# Patient Record
Sex: Male | Born: 1940 | Race: White | Hispanic: No | Marital: Married | Smoking: Current every day smoker
Health system: Southern US, Community
[De-identification: ages and names within clinical notes are randomized; demographics above are authoritative.]

## PROBLEM LIST (undated history)

## (undated) DIAGNOSIS — Z8551 Personal history of malignant neoplasm of bladder: Secondary | ICD-10-CM

## (undated) DIAGNOSIS — I6930 Unspecified sequelae of cerebral infarction: Secondary | ICD-10-CM

## (undated) DIAGNOSIS — M48061 Spinal stenosis, lumbar region without neurogenic claudication: Secondary | ICD-10-CM

## (undated) DIAGNOSIS — I491 Atrial premature depolarization: Secondary | ICD-10-CM

## (undated) DIAGNOSIS — I639 Cerebral infarction, unspecified: Secondary | ICD-10-CM

## (undated) DIAGNOSIS — I6529 Occlusion and stenosis of unspecified carotid artery: Secondary | ICD-10-CM

## (undated) DIAGNOSIS — Z860100 Personal history of colon polyps, unspecified: Secondary | ICD-10-CM

## (undated) DIAGNOSIS — Z87898 Personal history of other specified conditions: Secondary | ICD-10-CM

## (undated) DIAGNOSIS — Z8679 Personal history of other diseases of the circulatory system: Secondary | ICD-10-CM

## (undated) DIAGNOSIS — I700 Atherosclerosis of aorta: Secondary | ICD-10-CM

## (undated) DIAGNOSIS — R001 Bradycardia, unspecified: Secondary | ICD-10-CM

## (undated) DIAGNOSIS — M1990 Unspecified osteoarthritis, unspecified site: Secondary | ICD-10-CM

## (undated) DIAGNOSIS — I10 Essential (primary) hypertension: Secondary | ICD-10-CM

## (undated) HISTORY — DX: Cerebral infarction, unspecified: I63.9

## (undated) HISTORY — PX: OTHER SURGICAL HISTORY: SHX169

---
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
## 1999-10-26 ENCOUNTER — Encounter: Payer: Self-pay | Admitting: Specialist

## 1999-10-26 ENCOUNTER — Ambulatory Visit (HOSPITAL_COMMUNITY): Admission: RE | Admit: 1999-10-26 | Discharge: 1999-10-26 | Payer: Self-pay | Admitting: Specialist

## 2001-08-17 ENCOUNTER — Ambulatory Visit (HOSPITAL_COMMUNITY): Admission: RE | Admit: 2001-08-17 | Discharge: 2001-08-17 | Payer: Self-pay | Admitting: Gastroenterology

## 2001-08-17 ENCOUNTER — Encounter (INDEPENDENT_AMBULATORY_CARE_PROVIDER_SITE_OTHER): Payer: Self-pay

## 2010-03-24 ENCOUNTER — Ambulatory Visit
Admission: RE | Admit: 2010-03-24 | Discharge: 2010-03-25 | Payer: Self-pay | Source: Home / Self Care | Attending: Urology | Admitting: Urology

## 2010-03-24 HISTORY — PX: TRANSURETHRAL RESECTION OF BLADDER TUMOR WITH MITOMYCIN-C: SHX6459

## 2010-03-25 LAB — HEMOGLOBIN AND HEMATOCRIT, BLOOD
HCT: 32.2 % — ABNORMAL LOW (ref 39.0–52.0)
Hemoglobin: 10.9 g/dL — ABNORMAL LOW (ref 13.0–17.0)

## 2010-05-05 ENCOUNTER — Ambulatory Visit (HOSPITAL_BASED_OUTPATIENT_CLINIC_OR_DEPARTMENT_OTHER)
Admission: RE | Admit: 2010-05-05 | Discharge: 2010-05-05 | Disposition: A | Discharge status: Home / Self Care | Payer: Medicare Other | Attending: Urology | Admitting: Urology

## 2010-05-05 ENCOUNTER — Other Ambulatory Visit: Payer: Self-pay | Admitting: Urology

## 2010-05-05 DIAGNOSIS — C679 Malignant neoplasm of bladder, unspecified: Secondary | ICD-10-CM | POA: Insufficient documentation

## 2010-05-05 DIAGNOSIS — Z01812 Encounter for preprocedural laboratory examination: Secondary | ICD-10-CM | POA: Insufficient documentation

## 2010-05-05 HISTORY — PX: CYSTOSTOMY W/ BLADDER BIOPSY: SHX1431

## 2010-05-05 LAB — POCT I-STAT 4, (NA,K, GLUC, HGB,HCT)
Glucose, Bld: 96 mg/dL (ref 70–99)
HCT: 39 % (ref 39.0–52.0)
Hemoglobin: 13.3 g/dL (ref 13.0–17.0)
Potassium: 4.2 mEq/L (ref 3.5–5.1)
Sodium: 140 mEq/L (ref 135–145)

## 2010-05-07 NOTE — Op Note (Signed)
  NAME:  Philip Ward, Philip Ward NO.:  [id number]  MEDICAL RECORD NO.:  [id number]          PATIENT TYPE:  AMB  LOCATION:  [facility]                         FACILITY:  [facility]  PHYSICIAN:  Maretta Bees. Vonita Moss, M.D.DATE OF BIRTH:  1940-08-10  DATE OF PROCEDURE:  05/05/2010 DATE OF DISCHARGE:                              OPERATIVE REPORT   PREOPERATIVE DIAGNOSIS:  Bladder carcinoma.  POSTOPERATIVE DIAGNOSIS:  Bladder carcinoma.  PROCEDURE:  Cystoscopy and cold cup bladder biopsy.  SURGEON:  Maretta Bees. Vonita Moss, M.D.  ANESTHESIA:  General.  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 Fortunately, there was no obvious invasion.  He comes back now for restaging.  DESCRIPTION OF PROCEDURE:  The patient was brought to operating room and placed in lithotomy position.  External genitalia prepped and draped in usual fashion.  He was cystoscoped and the anterior urethra was normal. He had partial prostatic obstruction.  The resected tumor on the right wall and floor and left wall was healing with fibrinous tissue.  There was no obvious residual tumor there or anywhere else in the bladder. The ureteral orifices were intact.  Cold cup bladder biopsy forceps were utilized to obtain biopsies equivalent to a medium sized bladder tumor. The biopsy sites and tumor edges were fulgurated with the Bugbee electrode.  Blood loss was negligible.  At the end of the case, there was good hemostasis.  A 20-French Foley catheter was inserted and 40 mg of mitomycin-C and 40 cc of irrigant was left indwelling for 30 minutes. He tolerated the procedure well.     Maretta Bees. Vonita Moss, M.D.     LJP/MEDQ  D:  05/05/2010  T:  05/05/2010  Job:  [id number]  cc:   Gwen Pounds, MD Fax: [phone]  Electronically Signed by Larey Dresser M.D. on 05/07/2010 03:17:03 PM

## 2010-05-07 NOTE — Op Note (Signed)
  NAME:  Philip Ward, Philip Ward NO.:  [id number]  MEDICAL RECORD NO.:  [id number]          PATIENT TYPE:  AMB  LOCATION:  [facility]                         FACILITY:  [facility]  PHYSICIAN:  Maretta Bees. Vonita Moss, M.D.DATE OF BIRTH:  May 31, 1940  DATE OF PROCEDURE: DATE OF DISCHARGE:                              OPERATIVE REPORT   PREOPERATIVE DIAGNOSIS:  Bladder Cancer  POSTOPERATIVE DIAGNOSIS:  Bladder Cancer  PROCEDURE:  Cystoscopy and transurethral resection of large bladder tumor and instillation of mitomycin C.  SURGEON:  Dr. Larey Dresser  ANESTHESIA:  General.  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  IV contrasted CT scan revealed normal upper urinary tracts, but numerous filling defects were across the bladder base consistent with carcinoma of the bladder.  There was no evidence of metastatic disease.  He was brought to the OR today for further treatment and evaluation.  He was advised about the risk of bladder perforation, hematuria, and the need for a catheter postoperatively.  PROCEDURE:  The patient was brought to the operating room and placed in lithotomy position.  External genitalia were prepped and draped in the usual fashion.  He was cystoscoped.  The anterior urethra was normal. The prostate was small with bilobar hypertrophy.  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  He had also other scattered papillary tumors on the bladder floor.  I then sounded him to 30-French and inserted a resectoscope sheath.  I then resected and fulgurated this tumor.  I fulgurated lots of the small papillary tumors and superficial-appearing tumors to minimize deeper resection through the procedure.  At the end of the case, it appeared as if I had  resected or fulgurated all grossly visible tumor.  In toto, it was certainly a large bladder carcinoma. Estimated blood loss was less than 50 mL.  He tolerated the procedure well.  At the end of case, there was good hemostasis, and a 22-French 5 mL Foley catheter was inserted for 30 minutes of instillation of 40 mg of mitomycin C.  He was taken to recovery room in good condition having tolerated the procedure well.     Maretta Bees. Vonita Moss, M.D.     LJP/MEDQ  D:  03/24/2010  T:  03/24/2010  Job:  [id number]  cc:   Gwen Pounds, MD Fax: [phone]  Electronically Signed by Larey Dresser M.D. on 05/07/2010 03:16:35 PM

## 2010-06-01 LAB — POCT I-STAT 4, (NA,K, GLUC, HGB,HCT)
Glucose, Bld: 108 mg/dL — ABNORMAL HIGH (ref 70–99)
HCT: 38 % — ABNORMAL LOW (ref 39.0–52.0)
Hemoglobin: 12.9 g/dL — ABNORMAL LOW (ref 13.0–17.0)
Potassium: 4.5 mEq/L (ref 3.5–5.1)
Sodium: 142 mEq/L (ref 135–145)

## 2010-08-07 NOTE — Procedures (Signed)
[facility]  Patient:    Philip Ward, Philip Ward Visit Number: [id number] MRN: [id number]          Service Type: END Location: ENDO Attending Physician:  Nelda Marseille Dictated by:   Petra Kuba, M.D. Proc. Date: 08/17/01 Admit Date:  08/17/2001   CC:         Gwen Pounds, M.D.   Procedure Report  PROCEDURE:  Colonoscopy with polypectomy.  INDICATIONS FOR PROCEDURE:  Bright red blood per rectum in a patient well overdue for colonic screening, also with anemia.  Consent was signed after risks, benefits, methods, and options were thoroughly discussed in the office.  MEDICINES USED:  Demerol 100, Versed 8.  DESCRIPTION OF PROCEDURE:  Rectal inspection is pertinent for small external hemorrhoids. Digital exam was negative. The video pediatric adjustable colonoscope was inserted and with some difficulty due to left sided diverticula and tortuosity was able to be advanced to the cecum. This did require abdominal pressure but no position changes. Other than the left sided diverticula, no other abnormalities were seen on insertion. The cecum was identified by the appendiceal orifice and the ileocecal valve. In fact, the scope was inserted a short ways into the terminal ileum which was normal. Photo documentation was obtained, the scope was slowly withdrawn. On slow withdrawal through the colon, the cecum, ascending and transverse were normal. As the scope was withdrawn around the descending, the diverticula began which increased in the sigmoid with increased tortuosity and wall edema. In the distal sigmoid, a small polyp was seen, snared, electrocautery applied and the polyp was suctioned through the scope and collected in the trap. As the scope was slowly withdrawn, multiple hyperplastic appearing polyps were seen and were all hot biopsied in both the distal sigmoid and the rectum. They were all put in the same container. Once back in the rectum,  the scope was retroflexed pertinent for some tiny internal hemorrhoids. The scope was straightened and readvanced a short ways up the left side of the colon, air was suctioned, scope removed. The patient tolerated the procedure well and there was no obvious or immediate complication.  ENDOSCOPIC DIAGNOSIS:  1. Internal/external hemorrhoids.  2. Left sided diverticula and tortuosity.  3. Multiple tiny rectal and distal sigmoid polyps, the majority looked     hyperplastic, hot biopsied with one small polyp snared.  4. Otherwise within normal limits to the terminal ileum.  PLAN:  Await pathology to determine future colonic screening. Happy to see back p.r.n. otherwise will put him on the customary one week customary post polypectomy instructions but leave health care maintenance to Dr. Timothy Lasso to include yearly rectals and guaiacs.Dictated by:   Petra Kuba, M.D.  Attending Physician:  Nelda Marseille DD:  08/17/01 TD:  08/18/01 Job: [id number]

## 2011-05-28 DIAGNOSIS — R972 Elevated prostate specific antigen [PSA]: Secondary | ICD-10-CM | POA: Diagnosis not present

## 2011-05-28 DIAGNOSIS — Z8551 Personal history of malignant neoplasm of bladder: Secondary | ICD-10-CM | POA: Diagnosis not present

## 2011-06-07 DIAGNOSIS — N401 Enlarged prostate with lower urinary tract symptoms: Secondary | ICD-10-CM | POA: Diagnosis not present

## 2011-06-07 DIAGNOSIS — Z8551 Personal history of malignant neoplasm of bladder: Secondary | ICD-10-CM | POA: Diagnosis not present

## 2011-06-07 DIAGNOSIS — R972 Elevated prostate specific antigen [PSA]: Secondary | ICD-10-CM | POA: Diagnosis not present

## 2011-06-15 DIAGNOSIS — R55 Syncope and collapse: Secondary | ICD-10-CM | POA: Diagnosis not present

## 2011-06-15 DIAGNOSIS — I658 Occlusion and stenosis of other precerebral arteries: Secondary | ICD-10-CM | POA: Diagnosis not present

## 2011-06-28 ENCOUNTER — Other Ambulatory Visit (HOSPITAL_COMMUNITY): Payer: Self-pay | Admitting: Internal Medicine

## 2011-06-28 DIAGNOSIS — R55 Syncope and collapse: Secondary | ICD-10-CM

## 2011-06-29 ENCOUNTER — Ambulatory Visit (HOSPITAL_BASED_OUTPATIENT_CLINIC_OR_DEPARTMENT_OTHER): Payer: Medicare Other

## 2011-06-29 ENCOUNTER — Other Ambulatory Visit: Payer: Self-pay

## 2011-06-29 DIAGNOSIS — R55 Syncope and collapse: Secondary | ICD-10-CM | POA: Diagnosis not present

## 2011-06-29 HISTORY — PX: TRANSTHORACIC ECHOCARDIOGRAM: SHX275

## 2011-06-30 ENCOUNTER — Other Ambulatory Visit (HOSPITAL_COMMUNITY): Payer: Medicare Other

## 2011-06-30 ENCOUNTER — Emergency Department (HOSPITAL_COMMUNITY): Payer: Medicare Other

## 2011-06-30 ENCOUNTER — Inpatient Hospital Stay (HOSPITAL_COMMUNITY)
Admission: EM | Admit: 2011-06-30 | Discharge: 2011-07-02 | DRG: 066 | Disposition: A | Discharge status: Home / Self Care | Payer: Medicare Other | Source: Ambulatory Visit | Attending: Internal Medicine | Admitting: Internal Medicine

## 2011-06-30 ENCOUNTER — Encounter (HOSPITAL_COMMUNITY): Payer: Self-pay | Admitting: Internal Medicine

## 2011-06-30 ENCOUNTER — Encounter (HOSPITAL_COMMUNITY): Payer: Self-pay

## 2011-06-30 DIAGNOSIS — I679 Cerebrovascular disease, unspecified: Secondary | ICD-10-CM | POA: Diagnosis not present

## 2011-06-30 DIAGNOSIS — I10 Essential (primary) hypertension: Secondary | ICD-10-CM | POA: Diagnosis present

## 2011-06-30 DIAGNOSIS — Z7982 Long term (current) use of aspirin: Secondary | ICD-10-CM

## 2011-06-30 DIAGNOSIS — I639 Cerebral infarction, unspecified: Secondary | ICD-10-CM | POA: Diagnosis present

## 2011-06-30 DIAGNOSIS — I6350 Cerebral infarction due to unspecified occlusion or stenosis of unspecified cerebral artery: Principal | ICD-10-CM | POA: Diagnosis present

## 2011-06-30 DIAGNOSIS — Z8673 Personal history of transient ischemic attack (TIA), and cerebral infarction without residual deficits: Secondary | ICD-10-CM

## 2011-06-30 DIAGNOSIS — N400 Enlarged prostate without lower urinary tract symptoms: Secondary | ICD-10-CM | POA: Diagnosis present

## 2011-06-30 DIAGNOSIS — I499 Cardiac arrhythmia, unspecified: Secondary | ICD-10-CM | POA: Diagnosis present

## 2011-06-30 DIAGNOSIS — Z79899 Other long term (current) drug therapy: Secondary | ICD-10-CM | POA: Diagnosis not present

## 2011-06-30 DIAGNOSIS — F17200 Nicotine dependence, unspecified, uncomplicated: Secondary | ICD-10-CM | POA: Diagnosis present

## 2011-06-30 DIAGNOSIS — R001 Bradycardia, unspecified: Secondary | ICD-10-CM

## 2011-06-30 DIAGNOSIS — I2510 Atherosclerotic heart disease of native coronary artery without angina pectoris: Secondary | ICD-10-CM | POA: Diagnosis present

## 2011-06-30 DIAGNOSIS — R5381 Other malaise: Secondary | ICD-10-CM | POA: Diagnosis not present

## 2011-06-30 DIAGNOSIS — Z8551 Personal history of malignant neoplasm of bladder: Secondary | ICD-10-CM

## 2011-06-30 DIAGNOSIS — H918X9 Other specified hearing loss, unspecified ear: Secondary | ICD-10-CM | POA: Diagnosis not present

## 2011-06-30 DIAGNOSIS — I498 Other specified cardiac arrhythmias: Secondary | ICD-10-CM | POA: Diagnosis not present

## 2011-06-30 DIAGNOSIS — IMO0002 Reserved for concepts with insufficient information to code with codable children: Secondary | ICD-10-CM

## 2011-06-30 DIAGNOSIS — I658 Occlusion and stenosis of other precerebral arteries: Secondary | ICD-10-CM | POA: Diagnosis not present

## 2011-06-30 DIAGNOSIS — R200 Anesthesia of skin: Secondary | ICD-10-CM

## 2011-06-30 DIAGNOSIS — Z720 Tobacco use: Secondary | ICD-10-CM | POA: Diagnosis present

## 2011-06-30 DIAGNOSIS — I6330 Cerebral infarction due to thrombosis of unspecified cerebral artery: Secondary | ICD-10-CM | POA: Diagnosis not present

## 2011-06-30 DIAGNOSIS — Z01812 Encounter for preprocedural laboratory examination: Secondary | ICD-10-CM

## 2011-06-30 DIAGNOSIS — I6509 Occlusion and stenosis of unspecified vertebral artery: Secondary | ICD-10-CM | POA: Diagnosis not present

## 2011-06-30 DIAGNOSIS — R209 Unspecified disturbances of skin sensation: Secondary | ICD-10-CM | POA: Diagnosis not present

## 2011-06-30 DIAGNOSIS — Z87898 Personal history of other specified conditions: Secondary | ICD-10-CM | POA: Diagnosis not present

## 2011-06-30 DIAGNOSIS — R55 Syncope and collapse: Secondary | ICD-10-CM | POA: Diagnosis not present

## 2011-06-30 DIAGNOSIS — R4789 Other speech disturbances: Secondary | ICD-10-CM | POA: Diagnosis not present

## 2011-06-30 DIAGNOSIS — R471 Dysarthria and anarthria: Secondary | ICD-10-CM

## 2011-06-30 HISTORY — DX: Bradycardia, unspecified: R00.1

## 2011-06-30 HISTORY — DX: Atrial premature depolarization: I49.1

## 2011-06-30 HISTORY — DX: Unspecified osteoarthritis, unspecified site: M19.90

## 2011-06-30 HISTORY — DX: Essential (primary) hypertension: I10

## 2011-06-30 LAB — CK TOTAL AND CKMB (NOT AT ARMC)
CK, MB: 6.6 ng/mL (ref 0.3–4.0)
Relative Index: 1.1 (ref 0.0–2.5)
Total CK: 608 U/L — ABNORMAL HIGH (ref 7–232)

## 2011-06-30 LAB — DIFFERENTIAL
Basophils Absolute: 0 10*3/uL (ref 0.0–0.1)
Basophils Relative: 0 % (ref 0–1)
Eosinophils Absolute: 0.1 10*3/uL (ref 0.0–0.7)
Eosinophils Relative: 2 % (ref 0–5)
Lymphocytes Relative: 34 % (ref 12–46)
Lymphs Abs: 1.7 10*3/uL (ref 0.7–4.0)
Monocytes Absolute: 0.6 10*3/uL (ref 0.1–1.0)
Monocytes Relative: 11 % (ref 3–12)
Neutro Abs: 2.8 10*3/uL (ref 1.7–7.7)
Neutrophils Relative %: 54 % (ref 43–77)

## 2011-06-30 LAB — POCT I-STAT, CHEM 8
BUN: 29 mg/dL — ABNORMAL HIGH (ref 6–23)
Chloride: 107 mEq/L (ref 96–112)
Creatinine, Ser: 1.2 mg/dL (ref 0.50–1.35)
Sodium: 140 mEq/L (ref 135–145)

## 2011-06-30 LAB — RAPID URINE DRUG SCREEN, HOSP PERFORMED
Amphetamines: NOT DETECTED
Barbiturates: NOT DETECTED
Benzodiazepines: NOT DETECTED
Cocaine: NOT DETECTED
Opiates: NOT DETECTED
Tetrahydrocannabinol: NOT DETECTED

## 2011-06-30 LAB — CBC
HCT: 35.5 % — ABNORMAL LOW (ref 39.0–52.0)
Hemoglobin: 12 g/dL — ABNORMAL LOW (ref 13.0–17.0)
MCH: 31.3 pg (ref 26.0–34.0)
MCHC: 33.8 g/dL (ref 30.0–36.0)
MCV: 92.7 fL (ref 78.0–100.0)
Platelets: 187 10*3/uL (ref 150–400)
RBC: 3.83 MIL/uL — ABNORMAL LOW (ref 4.22–5.81)
RDW: 12.9 % (ref 11.5–15.5)
WBC: 5.2 10*3/uL (ref 4.0–10.5)

## 2011-06-30 LAB — COMPREHENSIVE METABOLIC PANEL
ALT: 22 U/L (ref 0–53)
AST: 36 U/L (ref 0–37)
Albumin: 4 g/dL (ref 3.5–5.2)
Alkaline Phosphatase: 68 U/L (ref 39–117)
BUN: 26 mg/dL — ABNORMAL HIGH (ref 6–23)
CO2: 29 mEq/L (ref 19–32)
Calcium: 9.2 mg/dL (ref 8.4–10.5)
Chloride: 103 mEq/L (ref 96–112)
Creatinine, Ser: 1.19 mg/dL (ref 0.50–1.35)
GFR calc Af Amer: 69 mL/min — ABNORMAL LOW (ref >90)
GFR calc non Af Amer: 60 mL/min — ABNORMAL LOW (ref >90)
Glucose, Bld: 101 mg/dL — ABNORMAL HIGH (ref 70–99)
Potassium: 3.8 mEq/L (ref 3.5–5.1)
Sodium: 139 mEq/L (ref 135–145)
Total Bilirubin: 0.5 mg/dL (ref 0.3–1.2)
Total Protein: 7 g/dL (ref 6.0–8.3)

## 2011-06-30 LAB — GLUCOSE, CAPILLARY: Glucose-Capillary: 95 mg/dL (ref 70–99)

## 2011-06-30 LAB — PROTIME-INR
INR: 0.98 (ref 0.00–1.49)
Prothrombin Time: 13.2 seconds (ref 11.6–15.2)

## 2011-06-30 LAB — TROPONIN I: Troponin I: <0.3 ng/mL (ref <0.30)

## 2011-06-30 LAB — CARDIAC PANEL(CRET KIN+CKTOT+MB+TROPI)
CK, MB: 4.6 ng/mL — ABNORMAL HIGH (ref 0.3–4.0)
Relative Index: 1.1 (ref 0.0–2.5)
Total CK: 421 U/L — ABNORMAL HIGH (ref 7–232)

## 2011-06-30 LAB — APTT: aPTT: 38 seconds — ABNORMAL HIGH (ref 24–37)

## 2011-06-30 MED ORDER — ONDANSETRON HCL 4 MG/2ML IJ SOLN: 4.0000 mg | Freq: Four times a day (QID) | INTRAMUSCULAR | Status: DC | PRN

## 2011-06-30 MED ORDER — RAMIPRIL 10 MG PO CAPS
10.0000 mg | ORAL_CAPSULE | Freq: Every day | ORAL | Status: DC
  Administered 2011-07-01 – 2011-07-02 (×2): 10 mg via ORAL
  Filled 2011-06-30 (×2): qty 1

## 2011-06-30 MED ORDER — SODIUM CHLORIDE 0.9 % IV SOLN
INTRAVENOUS | Status: DC
  Administered 2011-06-30: 20 mL via INTRAVENOUS

## 2011-06-30 MED ORDER — ASPIRIN EC 325 MG PO TBEC
325.0000 mg | DELAYED_RELEASE_TABLET | Freq: Every day | ORAL | Status: DC
  Administered 2011-07-01 – 2011-07-02 (×2): 325 mg via ORAL
  Filled 2011-06-30 (×4): qty 1

## 2011-06-30 MED ORDER — ENOXAPARIN SODIUM 40 MG/0.4ML ~~LOC~~ SOLN
40.0000 mg | SUBCUTANEOUS | Status: DC
  Administered 2011-06-30 – 2011-07-01 (×2): 40 mg via SUBCUTANEOUS
  Filled 2011-06-30 (×3): qty 0.4

## 2011-06-30 MED ORDER — ASPIRIN 300 MG RE SUPP
300.0000 mg | Freq: Every day | RECTAL | Status: DC
Start: 2011-06-30 — End: 2011-07-01
  Filled 2011-06-30 (×2): qty 1

## 2011-06-30 MED ORDER — ADULT MULTIVITAMIN W/MINERALS CH
1.0000 | ORAL_TABLET | Freq: Every day | ORAL | Status: DC
  Administered 2011-07-01 – 2011-07-02 (×2): 1 via ORAL
  Filled 2011-06-30 (×2): qty 1

## 2011-06-30 MED ORDER — SENNOSIDES-DOCUSATE SODIUM 8.6-50 MG PO TABS: 1.0000 | ORAL_TABLET | Freq: Every evening | ORAL | Status: DC | PRN

## 2011-06-30 NOTE — ED Notes (Addendum)
Pt received from [facility]. Pt is A/A/Ox4, skin is warm and dry, respiration is even and unlabored. Stroke team is at the bedside. Pt claimed that his symptoms are getting better. He is able to speak better but still with a little slurring noted.

## 2011-06-30 NOTE — ED Notes (Signed)
Pt transported to CT ?

## 2011-06-30 NOTE — ED Provider Notes (Signed)
3:39 PM 71 yo male seen after being transferred from [facility] for a code stroke. Right lip and tongue numbness and slurred speech since 1:45 pm. ABCs intact. NIH stroke scale of 1, and symptoms are improving, so not a TPA candidate. Will admit to medicine [facility])  Pricilla Loveless, MD 06/30/11 [phone]

## 2011-06-30 NOTE — ED Notes (Signed)
Dr. Criss Alvine was made aware of patient's elevated Total CPK and CKMB.

## 2011-06-30 NOTE — ED Provider Notes (Signed)
History     CSN: [id number]  Arrival date & time 06/30/11  1435   First MD Initiated Contact with Patient 06/30/11 1439      Chief Complaint  Patient presents with  . Code Stroke    (Consider location/radiation/quality/duration/timing/severity/associated sxs/prior treatment) HPI A LEVEL 5 CAVEAT PERTAINS DUE TO URGENT NEED FOR INTERVENTION Pt presents with acute onset of right sided facial numbness, slurred speech and right sided facial droop.  No weakness of extremities. Pt states symptoms began approx 1 hour prior to arrival in ED.   Past Medical History  Diagnosis Date  . Hypertension   . Coronary artery disease     No past surgical history on file.  No family history on file.  History  Substance Use Topics  . Smoking status: Not on file  . Smokeless tobacco: Not on file  . Alcohol Use:       Review of Systems ROS reviewed and all otherwise negative except for mentioned in HPI  Allergies  Review of patient's allergies indicates not on file.  Home Medications  No current outpatient prescriptions on file.  BP 180/79  Temp(Src) 98.6 F (37 C) (Oral)  Resp 18  SpO2 100% Vitals reviewed Physical Exam Physical Examination: General appearance - alert, well appearing, and in no distress Mental status - alert, oriented to person, place, and time Eyes - pupils equal and reactive, extraocular eye movements intact Mouth - mucous membranes moist, pharynx normal without lesions Chest - clear to auscultation, no wheezes, rales or rhonchi, symmetric air entry Heart - normal rate, regular rhythm, normal S1, S2, no murmurs, rubs, clicks or gallops Abdomen - soft, nontender, nondistended, no masses or organomegaly Neurological - alert, oriented, somewhat slurred speech, cranial nerves tested and intact, strength 5/5 in extremities x 4, sensation intact, no facial droop Extremities - peripheral pulses normal, no pedal edema, no clubbing or cyanosis Skin - normal  coloration and turgor, no rashes  ED Course  Procedures (including critical care time)  3:04 PM d/w Dr. Lyman Speller, neurology- pt to be transferred to [facility] for neurology evaluation.  Pt is not likely a candidate for TPA due to low NIH stroke scale.  [facility] is here to transport the patient, also [facility] who is aware of patient transfer.    Date: 06/30/2011  Rate: 55  Rhythm: normal sinus rhythm  QRS Axis: normal  Intervals: normal  ST/T Wave abnormalities: normal  Conduction Disutrbances:none  Narrative Interpretation: LVH  Old EKG Reviewed: no significant change compared to prior ekg of 03/24/10    Labs Reviewed  CBC - Abnormal; Notable for the following:    RBC 3.83 (*)    Hemoglobin 12.0 (*)    HCT 35.5 (*)    All other components within normal limits  POCT I-STAT, CHEM 8 - Abnormal; Notable for the following:    BUN 29 (*)    Hemoglobin 12.6 (*)    HCT 37.0 (*)    All other components within normal limits  DIFFERENTIAL  PROTIME-INR  APTT  COMPREHENSIVE METABOLIC PANEL  CK TOTAL AND CKMB  TROPONIN I  URINE RAPID DRUG SCREEN (HOSP PERFORMED)   No results found.   1. Dysarthria   2. Facial numbness       MDM  Pt presents with c/o right sided facial numbness and dysarthria which began 1 hour prior to ED evaluation. Code stroke called, d/w Dr. Lyman Speller- who accepted patient in transfer to [facility], he will likely not be  a candidate for TPA due to low NIH scale.  D/w patient who is agreeable with plan for transfer.         Ethelda Chick, MD 06/30/11 [phone]

## 2011-06-30 NOTE — ED Notes (Signed)
Called MRI.  They stated they are coming to take pt.  PT ao x 4.  Denies any numbness to lower lip, however family states pt speech, though improved from before, is still slightly slurred.

## 2011-06-30 NOTE — ED Notes (Signed)
Pt reports having difficulty swallowing and facial and mouth numbness to right side of face. Slurred speech noted. Symptoms began at 13:45 today. Pt is A/O x4. No facial droop noted. Skin warm and dry. Respirations even and unlabored. NAD noted at this time.

## 2011-06-30 NOTE — Code Documentation (Signed)
71 y/o male transferred from [facility] as Code Stroke.  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  He denied symptoms initially for about 15-20 mins.  When he acknowledged the sx it was 1345 but he recalls sx began about 1315 so LSW 1315. .  Presented to [facility] ED at 1426 where CT scan was done and Code Stroke initiated at 1442..  Labs were drawn at [facility] at 1450.   Patient was transported to [facility] ED - arrived at 77.  NIHSS at [facility] was 25for dysarthria - see flow sheet.  Dr. Lyman Speller present.  Patient also reports episode of syncope last week in OR and was worked up by his medical MD with carotid doppler studies and 2D ECHO.  Continue to watch for worsening sx - will remain inside tpa timeframe until 1745 - patient and RN aware.

## 2011-06-30 NOTE — ED Provider Notes (Signed)
I saw and evaluated the patient, reviewed the resident's note and I agree with the findings and plan.   Gwyneth Sprout, MD 06/30/11 [phone]

## 2011-06-30 NOTE — ED Notes (Signed)
[facility] at bedside 

## 2011-06-30 NOTE — ED Notes (Signed)
AVW:[id number]<XB> Expected date:<BR> Expected time:<BR> Means of arrival:<BR> Comments:<BR> Pt from triage

## 2011-06-30 NOTE — ED Notes (Signed)
Pt was seen and examined by Dr. Criss Alvine

## 2011-06-30 NOTE — ED Notes (Signed)
Spoke with Dr Clelia Croft.  He is ordering diet.

## 2011-06-30 NOTE — ED Notes (Signed)
Patient transported to MRI 

## 2011-06-30 NOTE — ED Notes (Signed)
3027-01 Ready 

## 2011-06-30 NOTE — ED Notes (Signed)
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

## 2011-06-30 NOTE — Consult Note (Signed)
Chief Complaint: "slurred speech and peri-oral numbness"  HPI: Philip Ward is an 71 y.o. male who developed sudden onset slurred speech and peri-oral numbness. He had no other focal neurological deficits.   LSN: 1:15 pm tPA Given: No: Low NIHSS mRankin: 0  Past Medical History  Diagnosis Date  . Hypertension   . Coronary artery disease    No past surgical history on file.  No family history on file. Social History:  does not have a smoking history on file. He does not have any smokeless tobacco history on file. His alcohol and drug histories not on file.  Allergies: Allergies not on file  Medications: I have reviewed the patient's current medications.  ROS: As above  Physical Examination: Blood pressure 178/74, pulse 56, temperature 98.1 F (36.7 C), temperature source Oral, resp. rate 16, SpO2 100.00%.  Neurologic Examination: Neurological exam: AAO*3. No aphasia. Followed complex commands. Cranial nerves: EOMI, PERRL. Visual fields were full. Sensation to V1 through V3 areas of the face was intact and symmetric throughout. There was no facial asymmetry. Hearing to finger rub was equal and symmetrical bilaterally. Shoulder shrug was 5/5 and symmetric bilaterally. Head rotation was 5/5 bilaterally. There was mild dysarthria. Motor: strength was 5/5 and symmetric throughout. Sensory: was intact throughout to light touch, pinprick. Coordination: finger-to-nose were intact and symmetric bilaterally. Reflexes: were 2+ in upper extremities and 1+ at the knees and 1+ at the ankles. Plantar response was downgoing bilaterally. Gait: deferred.  Results for orders placed during the hospital encounter of 06/30/11 (from the past 48 hour(s))  CBC     Status: Abnormal   Collection Time   06/30/11  2:50 PM      Component Value Range Comment   WBC 5.2  4.0 - 10.5 (K/uL)    RBC 3.83 (*) 4.22 - 5.81 (MIL/uL)    Hemoglobin 12.0 (*) 13.0 - 17.0 (g/dL)    HCT 04.5 (*) 40.9 - 52.0 (%)    MCV 92.7  78.0 - 100.0 (fL)    MCH 31.3  26.0 - 34.0 (pg)    MCHC 33.8  30.0 - 36.0 (g/dL)    RDW 81.1  91.4 - 78.2 (%)    Platelets 187  150 - 400 (K/uL)   DIFFERENTIAL     Status: Normal   Collection Time   06/30/11  2:50 PM      Component Value Range Comment   Neutrophils Relative 54  43 - 77 (%)    Neutro Abs 2.8  1.7 - 7.7 (K/uL)    Lymphocytes Relative 34  12 - 46 (%)    Lymphs Abs 1.7  0.7 - 4.0 (K/uL)    Monocytes Relative 11  3 - 12 (%)    Monocytes Absolute 0.6  0.1 - 1.0 (K/uL)    Eosinophils Relative 2  0 - 5 (%)    Eosinophils Absolute 0.1  0.0 - 0.7 (K/uL)    Basophils Relative 0  0 - 1 (%)    Basophils Absolute 0.0  0.0 - 0.1 (K/uL)   COMPREHENSIVE METABOLIC PANEL     Status: Abnormal   Collection Time   06/30/11  2:50 PM      Component Value Range Comment   Sodium 139  135 - 145 (mEq/L)    Potassium 3.8  3.5 - 5.1 (mEq/L)    Chloride 103  96 - 112 (mEq/L)    CO2 29  19 - 32 (mEq/L)    Glucose, Bld 101 (*) 70 -  99 (mg/dL)    BUN 26 (*) 6 - 23 (mg/dL)    Creatinine, Ser 4.54  0.50 - 1.35 (mg/dL)    Calcium 9.2  8.4 - 10.5 (mg/dL)    Total Protein 7.0  6.0 - 8.3 (g/dL)    Albumin 4.0  3.5 - 5.2 (g/dL)    AST 36  0 - 37 (U/L)    ALT 22  0 - 53 (U/L)    Alkaline Phosphatase 68  39 - 117 (U/L)    Total Bilirubin 0.5  0.3 - 1.2 (mg/dL)    GFR calc non Af Amer 60 (*) >90 (mL/min)    GFR calc Af Amer 69 (*) >90 (mL/min)   TROPONIN I     Status: Normal   Collection Time   06/30/11  2:50 PM      Component Value Range Comment   Troponin I <0.30  <0.30 (ng/mL)   POCT I-STAT, CHEM 8     Status: Abnormal   Collection Time   06/30/11  3:00 PM      Component Value Range Comment   Sodium 140  135 - 145 (mEq/L)    Potassium 3.8  3.5 - 5.1 (mEq/L)    Chloride 107  96 - 112 (mEq/L)    BUN 29 (*) 6 - 23 (mg/dL)    Creatinine, Ser 0.98  0.50 - 1.35 (mg/dL)    Glucose, Bld 97  70 - 99 (mg/dL)    Calcium, Ion 1.19  1.12 - 1.32 (mmol/L)    TCO2 26  0 - 100 (mmol/L)     Hemoglobin 12.6 (*) 13.0 - 17.0 (g/dL)    HCT 14.7 (*) 82.9 - 52.0 (%)    Ct Head Wo Contrast  06/30/2011  *RADIOLOGY REPORT*  Clinical Data: Slurred speech.  Right facial numbness.  Dysarthria.  CT HEAD WITHOUT CONTRAST  Technique:  Contiguous axial images were obtained from the base of the skull through the vertex without contrast.  Comparison: None.  Findings: The brain shows mild age related atrophy.  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  There is mild chronic small vessel change within the hemispheric white matter.  No identifiably acute infarction, mass lesion, hemorrhage, hydrocephalus or extra-axial collection. There is atherosclerotic calcification of the major vessels at the base of the brain.  The calvarium is unremarkable.  Sinuses, middle ears and mastoids are clear.  Critical Value/emergent results were called by telephone at the time of interpretation on 06/30/2011  at 1510 hours  to  Dr. Karma Ganja, who verbally acknowledged these results.  IMPRESSION: No acute finding.  No intracranial hemorrhage.  Atrophy and chronic small vessel change.  Original Report Authenticated By: Thomasenia Sales, M.D.   Assessment: 71 y.o. male with sudden onset slurred speech and peri-oral numbness. At this time, this event appears to be a stroke.   Stroke Risk Factors - none  Plan: 1. HgbA1c, fasting lipid panel 2. MRI, MRA  of the brain without contrast 3. PT consult, OT consult, Speech consult 4. Echocardiogram - had one recently 5. Carotid dopplers - had one recently 6. Prophylactic therapy-Antiplatelet med: Aspirin - dose 81 mg PO daily 7. Risk factor modification 8. MAP b/w 120 to 130 9. IV fluids 10. Cardiac monitoring 11. Neurochecks q4h 12. Vitals q4h  Egan Berkheimer 06/30/2011, 3:28 PM

## 2011-06-30 NOTE — H&P (Signed)
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 Patient states that he was in his usual state of health today while operating when he suddenly developed slurred speech and right facial numbness. He stated that he was talking with his colleagues when he noticed he was having trouble forming words. This occurred at around 1:45 PM. He finished the case and actually noted that he was having a little bit of difficulty with right hand coordination but no apparent weakness. After completing the case he asked the physician if he noticed any change in his speech which was apparent. He called his primary care physician who recommended that he go directly to the emergency department. A code stroke was initiated and he was evaluated by head CT that showed no acute intracranial abnormality.  Neurology was consult and felt that he was not a TPA candidate due to Low NIHSS score. Admission to the medical service was recommended.  As I am evaluating patient he states that his slurred speech has improved slightly but he continues to have difficulty forming words. He has had no new neurologic developments.  In addition, the patient had an episode of syncope about 2 weeks ago. States at that time he while operating he had a sudden loss of consciousness. She fell onto the patient. No preceding symptoms. His no prior history of syncope. He was evaluated by Dr. Timothy Lasso a couple days after this event and underwent an outpatient echocardiogram that showed no significant valvular abnormalities and an ejection fraction of 55-60%. In addition, carotid Dopplers showed less than 50% stenosis bilaterally. At that time, he increased his aspirin from 81 mg daily 325 mg daily. He does have frequent cardiac ectopy but denies any history of cardiac arrhythmias or prior  syncopal spells.   Review of Systems:  Review of Systems - all systems reviewed with the patient and are negative as in history of present illness.   Past Medical History :  HTN, High frequency hearing loss, H/O Anemia, OA, Mild BPH, Bladder Cancer - Dr Annabell Howells - S/P 5 BCG Rxs  Lacerated cornea (1947)  Herniated disc (L4-L5)  DJD  Syncope 06/10/11 - echocardiogram and carotid Dopplers unrevealing  Past Surgical History Polypectomy  I&D  Repair corneal laceration, OS (1946)  Medications: Prior to Admission medications   Medication Sig Start Date End Date Taking? Authorizing Provider  aspirin EC 325 MG tablet Take 325 mg by mouth daily.   Yes Historical Provider, MD  b complex vitamins tablet Take 1 tablet by mouth every other day.   Yes Historical Provider, MD  Multiple Vitamin (MULITIVITAMIN WITH MINERALS) TABS Take 1 tablet by mouth daily.   Yes Historical Provider, MD  ramipril (ALTACE) 10 MG capsule Take 10 mg by mouth daily.   Yes Historical Provider, MD  vitamin C (ASCORBIC ACID) 500 MG tablet Take 500 mg by mouth daily.   Yes Historical Provider, MD    Allergies:  No Known Allergies  Family History: Father died age 92-CHF, COPD, cigarette smoker, OA.  Mother died age 11-CHF, CAD, rheumatic fever  Siblings: brother age 51-good  Family Hx: prostate cancer, pancreatic cancer, renal cell cancer   Social History: 2nd marriage. 1 child, 1 grandchild, 2 great grandchildren.  Education: [employer]  Occupation: PA [employer]  Tobacco: pipe/cigar  Alcohol: 2 beers/evening  Physical Exam: Filed Vitals:   06/30/11 1630 06/30/11 1700 06/30/11 1730 06/30/11 1800  BP: 139/60 137/57 145/59 145/65  Pulse: 54 55 67 58  Temp:      TempSrc:      Resp: 17 15 17 17   SpO2: 100% 100% 100% 100%   General appearance: alert and no distress Head: Normocephalic, without obvious abnormality, atraumatic Eyes: conjunctivae/corneas clear. Mild left ptosis. Extremities intact. Pupils  equal round and reactive to light Nose: Nares normal. Septum midline. Mucosa normal. No drainage or sinus tenderness. Throat: lips, mucosa, and tongue normal; teeth and gums normal Neck: no adenopathy, no carotid bruit, no JVD and thyroid not enlarged, symmetric, no tenderness/mass/nodules Resp: clear to auscultation bilaterally Cardio: regular rate and rhythm and Frequent ectopy (correlates with PACs on telemetry) GI: soft, non-tender; bowel sounds normal; no masses,  no organomegaly Extremities: extremities normal, atraumatic, no cyanosis or edema Pulses: 2+ and symmetric Lymph nodes: Cervical adenopathy: no cervical lymphadenopathy Neurologic: Alert and oriented X 3, normal strength and tone. Normal symmetric reflexes. Cranial nerves II through XII are intact except for left ptosis (questionably chronic) and mild right facial droop. Tongue is midline. Speech is slightly dysarthric.  Labs on Admission:   [facility] 06/30/11 1500 06/30/11 1450  NA 140 139  K 3.8 3.8  CL 107 103  CO2 -- 29  GLUCOSE 97 101*  BUN 29* 26*  CREATININE 1.20 1.19  CALCIUM -- 9.2  MG -- --  PHOS -- --    Basename 06/30/11 1450  AST 36  ALT 22  ALKPHOS 68  BILITOT 0.5  PROT 7.0  ALBUMIN 4.0    Basename 06/30/11 1500 06/30/11 1450  WBC -- 5.2  NEUTROABS -- 2.8  HGB 12.6* 12.0*  HCT 37.0* 35.5*  MCV -- 92.7  PLT -- 187    Basename 06/30/11 1450  CKTOTAL 608*  CKMB 6.6*  CKMBINDEX --  TROPONINI <0.30   Lab Results  Component Value Date   INR 0.98 06/30/2011    Radiological Exams on Admission: Ct Head Wo Contrast 06/30/2011  *RADIOLOGY REPORT*  Clinical Data: Slurred speech.  Right facial numbness.  Dysarthria.  CT HEAD WITHOUT CONTRAST  Technique:  Contiguous axial images were obtained from the base of the skull through the vertex without contrast.  Comparison: None.  Findings: The brain shows mild age related atrophy.  There is an old lacunar infarction in the right basal ganglia/anterior  limb internal capsule.  There is mild chronic small vessel change within the hemispheric white matter.  No identifiably acute infarction, mass lesion, hemorrhage, hydrocephalus or extra-axial collection. There is atherosclerotic calcification of the major vessels at the base of the brain.  The calvarium is unremarkable.  Sinuses, middle ears and mastoids are clear.  Critical Value/emergent results were called by telephone at the time of interpretation on 06/30/2011  at 1510 hours  to  Dr. Karma Ganja, who verbally acknowledged these results.  IMPRESSION: No acute finding.  No intracranial hemorrhage.  Atrophy and chronic small vessel change.  Original Report Authenticated By: Thomasenia Sales, M.D.   Echocardiogram (06/29/11)- Study Conclusions - Left ventricle: The cavity size was normal. Wall thickness was increased in a pattern of mild LVH. Systolic function was normal. The estimated ejection fraction was in the range of 55% to 60%. Wall motion was normal; there were no regional wall motion abnormalities. Left ventricular diastolic function parameters were normal. - Mitral valve: Mild regurgitation. - Pulmonary arteries: PA peak pressure: 33mm Hg (S).  Orders placed during the hospital encounter of 06/30/11  . ED EKG  . ED EKG    Assessment/Plan Principal Problem: 1. *Acute cerebrovascular accident- acute onset dysarthria, right facial droop, and right facial numbness consistent with ischemic CVA. Neurology did not feel that he was candidate for TPA given his low NIH stroke scale score. Head CT is unrevealing but we'll obtain an MRI/MRA. Recent carotid studies and echocardiogram will not be repeated.  We'll monitor on telemetry as he may have some underlying dysrhythmia given his recent syncopal spell and frequent ectopy on monitor. If her antiplatelet therapy to neurology/primary care physician as he has been on aspirin and it may be appropriate to transition him to Plavix or Aggrenox pending MRI  results. Obtain fasting lipid profile and consider statin therapy for aggressive risk factor modification.  Encouraged smoking cessation.  Active Problems: 2. Dysarthria due to cerebrovascular accident- We'll obtain a speech therapy consult and an RN bedside swallowing evaluation prior to eating given the risk of dysphagia. 3. Hypertension- continue Altace. Avoid hypotension. 4. Irregular heartbeat- monitor on telemetry and consider a Holter monitor given his recent syncopal spell. 5. elevated CK-his troponin is normal he has not had any chest pain. We'll monitor cardiac enzymes. EKG has been obtained but is not available for review (report in ED physician note indicates normal sinus rhythm with no acute changes). 5. History of syncope- consider cardiac evaluation for underlying arrhythmia 6. Nicotine abuse- strongly encourage smoking cessation. He does not feel that he needs a nicotine patch at this point. He is interested in Chantix upon discharge.   Teriah Muela,W DOUGLAS 06/30/2011, 6:39 PM

## 2011-07-01 ENCOUNTER — Encounter (HOSPITAL_COMMUNITY): Payer: Self-pay | Admitting: Radiology

## 2011-07-01 ENCOUNTER — Inpatient Hospital Stay (HOSPITAL_COMMUNITY): Payer: Medicare Other

## 2011-07-01 LAB — BASIC METABOLIC PANEL
CO2: 24 mEq/L (ref 19–32)
Chloride: 107 mEq/L (ref 96–112)
GFR calc non Af Amer: 82 mL/min — ABNORMAL LOW (ref >90)
Glucose, Bld: 93 mg/dL (ref 70–99)
Potassium: 3.9 mEq/L (ref 3.5–5.1)
Sodium: 141 mEq/L (ref 135–145)

## 2011-07-01 LAB — CBC
Hemoglobin: 11 g/dL — ABNORMAL LOW (ref 13.0–17.0)
RBC: 3.45 MIL/uL — ABNORMAL LOW (ref 4.22–5.81)
WBC: 4 10*3/uL (ref 4.0–10.5)

## 2011-07-01 LAB — LIPID PANEL
Cholesterol: 165 mg/dL (ref 0–200)
Total CHOL/HDL Ratio: 2.1 RATIO

## 2011-07-01 LAB — CARDIAC PANEL(CRET KIN+CKTOT+MB+TROPI)
Relative Index: 1.2 (ref 0.0–2.5)
Total CK: 302 U/L — ABNORMAL HIGH (ref 7–232)
Total CK: 247 U/L — ABNORMAL HIGH (ref 7–232)

## 2011-07-01 LAB — HEMOGLOBIN A1C
Hgb A1c MFr Bld: 5.6 % (ref <5.7)
Mean Plasma Glucose: 114 mg/dL (ref <117)

## 2011-07-01 MED ORDER — IOHEXOL 350 MG/ML SOLN
50.0000 mL | Freq: Once | INTRAVENOUS | Status: AC | PRN
  Administered 2011-07-01: 50 mL via INTRAVENOUS

## 2011-07-01 NOTE — Progress Notes (Signed)
Subjective: Admitted with CVA yesterday. MRI c/w Focal area of non hemorrhagic infarction involving the posterior left putamen and corona radiata. [place] He is better with minimal slurred speech. No Numbness or weakness. Doing better.  Objective: Vital signs in last 24 hours: Temp:  [97.5 F (36.4 C)-98.6 F (37 C)] 98.2 F (36.8 C) (04/11 0600) Pulse Rate:  [50-67] 50  (04/11 0600) Resp:  [15-18] 18  (04/11 0600) BP: (131-180)/(49-86) 134/75 mmHg (04/11 0600) SpO2:  [94 %-100 %] 97 % (04/11 0600) Weight:  [70.3 kg (154 lb 15.7 oz)] 70.3 kg (154 lb 15.7 oz) (04/10 2240) Weight change:     CBG (last 3)   Basename 06/30/11 1528  GLUCAP 95    Intake/Output from previous day:  Intake/Output Summary (Last 24 hours) at 07/01/11 0754 Last data filed at 07/01/11 0630  Gross per 24 hour  Intake      0 ml  Output    900 ml  Net   -900 ml   04/10 0701 - 04/11 0700 In: -  Out: 900 [Urine:900]   Physical Exam  General appearance: A and oriented OP - Min speech slur o/w back to normal. Eyes: no scleral icterus Throat: oropharynx moist without erythema Resp: CTA Cardio: Reg GI: soft, non-tender; bowel sounds normal; no masses,  no organomegaly Extremities: no clubbing, cyanosis or edema Neuro - back to near baseline.   Lab Results:  Basename 06/30/11 1500 06/30/11 1450  NA 140 139  K 3.8 3.8  CL 107 103  CO2 -- 29  GLUCOSE 97 101*  BUN 29* 26*  CREATININE 1.20 1.19  CALCIUM -- 9.2  MG -- --  PHOS -- --     Basename 06/30/11 1450  AST 36  ALT 22  ALKPHOS 68  BILITOT 0.5  PROT 7.0  ALBUMIN 4.0     Basename 07/01/11 0600 06/30/11 1500 06/30/11 1450  WBC 4.0 -- 5.2  NEUTROABS -- -- 2.8  HGB 11.0* 12.6* --  HCT 32.3* 37.0* --  MCV 93.6 -- 92.7  PLT 153 -- 187    Lab Results  Component Value Date   INR 0.98 06/30/2011     Basename 07/01/11 0600 06/30/11 2222 06/30/11 1450  CKTOTAL 302* 421* 608*  CKMB 3.4 4.6* 6.6*  CKMBINDEX -- -- --    TROPONINI <0.30 <0.30 <0.30    No results found for this basename: TSH,T4TOTAL,FREET3,T3FREE,THYROIDAB in the last 72 hours  No results found for this basename: VITAMINB12:2,FOLATE:2,FERRITIN:2,TIBC:2,IRON:2,RETICCTPCT:2 in the last 72 hours  Micro Results: No results found for this or any previous visit (from the past 240 hour(s)).   Studies/Results: Ct Head Wo Contrast  06/30/2011  *RADIOLOGY REPORT*  Clinical Data: Slurred speech.  Right facial numbness.  Dysarthria.  CT HEAD WITHOUT CONTRAST  Technique:  Contiguous axial images were obtained from the base of the skull through the vertex without contrast.  Comparison: None.  Findings: The brain shows mild age related atrophy.  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  There is mild chronic small vessel change within the hemispheric white matter.  No identifiably acute infarction, mass lesion, hemorrhage, hydrocephalus or extra-axial collection. There is atherosclerotic calcification of the major vessels at the base of the brain.  The calvarium is unremarkable.  Sinuses, middle ears and mastoids are clear.  Critical Value/emergent results were called by telephone at the time of interpretation on 06/30/2011  at 1510 hours  to  Dr. Karma Ganja, who verbally acknowledged  these results.  IMPRESSION: No acute finding.  No intracranial hemorrhage.  Atrophy and chronic small vessel change.  Original Report Authenticated By: Thomasenia Sales, M.D.   Mr Brain Wo Contrast  07/01/2011  *RADIOLOGY REPORT*  Clinical Data:  Acute onset of slurred speech.  Right-sided facial numbness.  MRI HEAD WITHOUT CONTRAST MRA HEAD WITHOUT CONTRAST  Technique:  Multiplanar, multiecho pulse sequences of the brain and surrounding structures were obtained without intravenous contrast. Angiographic images of the head were obtained using MRA technique without contrast.  Comparison:  CT head without contrast 06/30/2011.  MRI HEAD   Findings:  A focal area of restricted diffusion in the posterior left putamen and extending superiorly within the corona radiata measures 8 mm in maximal axial dimension.  The T2 and FLAIR hyperintensities associated.  Multiple other T2 and FLAIR hyperintensities are present in the periventricular and subcortical white matter bilaterally.  Flow is present in the major intracranial arteries.  The ventricles are proportionate to the degree of atrophy.  No significant extra-axial fluid collection is present.  The globes and orbits are intact.  The paranasal sinuses and mastoid air cells are clear.  IMPRESSION:  1.  Focal area of non hemorrhagic infarction involving the posterior left putamen and corona radiata. 2.  Other remote lacunar infarcts are present in the basal ganglia, particular the anterior putamen on the right. 3.  Atrophy and extensive white matter disease.  This likely reflects the sequelae of chronic microvascular ischemia.  MRA HEAD  Findings: The internal carotid arteries are within normal limits from the high cervical segments through the ICA termini.  The A1 and M1 segments are normal.  No definite anterior communicating artery is identified.  There is some tapering of distal MCA branch vessels.  The proximal ACA and MCA branch vessels are within normal limits.  The left vertebral artery is slightly dominant to the right.  The PICA origins are below the field of view bilaterally.  The basilar artery is within normal limits.  Both posterior cerebral arteries originate from the basilar tip.  There is some attenuation of distal PCA branch vessels.  IMPRESSION:  1.  Mild small vessel disease, less than expected given the brain MRI findings. 2.  No significant proximal stenosis, aneurysm, or branch vessel occlusion.  Original Report Authenticated By: Jamesetta Orleans. MATTERN, M.D.   Mr Maxine Glenn Head/brain Wo Cm  07/01/2011  *RADIOLOGY REPORT*  Clinical Data:  Acute onset of slurred speech.  Right-sided facial  numbness.  MRI HEAD WITHOUT CONTRAST MRA HEAD WITHOUT CONTRAST  Technique:  Multiplanar, multiecho pulse sequences of the brain and surrounding structures were obtained without intravenous contrast. Angiographic images of the head were obtained using MRA technique without contrast.  Comparison:  CT head without contrast 06/30/2011.  MRI HEAD  Findings:  A focal area of restricted diffusion in the posterior left putamen and extending superiorly within the corona radiata measures 8 mm in maximal axial dimension.  The T2 and FLAIR hyperintensities associated.  Multiple other T2 and FLAIR hyperintensities are present in the periventricular and subcortical white matter bilaterally.  Flow is present in the major intracranial arteries.  The ventricles are proportionate to the degree of atrophy.  No significant extra-axial fluid collection is present.  The globes and orbits are intact.  The paranasal sinuses and mastoid air cells are clear.  IMPRESSION:  1.  Focal area of non hemorrhagic infarction involving the posterior left putamen and corona radiata. 2.  Other remote lacunar infarcts are present  in the basal ganglia, particular the anterior putamen on the right. 3.  Atrophy and extensive white matter disease.  This likely reflects the sequelae of chronic microvascular ischemia.  MRA HEAD  Findings: The internal carotid arteries are within normal limits from the high cervical segments through the ICA termini.  The A1 and M1 segments are normal.  No definite anterior communicating artery is identified.  There is some tapering of distal MCA branch vessels.  The proximal ACA and MCA branch vessels are within normal limits.  The left vertebral artery is slightly dominant to the right.  The PICA origins are below the field of view bilaterally.  The basilar artery is within normal limits.  Both posterior cerebral arteries originate from the basilar tip.  There is some attenuation of distal PCA branch vessels.  IMPRESSION:  1.   Mild small vessel disease, less than expected given the brain MRI findings. 2.  No significant proximal stenosis, aneurysm, or branch vessel occlusion.  Original Report Authenticated By: Jamesetta Orleans. MATTERN, M.D.     Medications: Scheduled:   . aspirin EC  325 mg Oral Daily  . aspirin  300 mg Rectal Daily  . enoxaparin  40 mg Subcutaneous Q24H  . mulitivitamin with minerals  1 tablet Oral Daily  . ramipril  10 mg Oral Daily   Continuous:   . sodium chloride 20 mL (06/30/11 2200)     Assessment/Plan: Principal Problem:  *Acute cerebrovascular accident Active Problems:  Dysarthria due to cerebrovascular accident  Hypertension  Irregular heartbeat  History of syncope  Nicotine abuse  1. MRI c/w Focal area of non hemorrhagic infarction involving the posterior left putamen and corona radiata = Acute cerebrovascular accident- acute onset dysarthria, right facial droop, and right facial numbness consistent with ischemic CVA - good news is that he is 90+% resolved. Neurology did not feel that he was candidate for TPA given his low NIH stroke scale score.  Recent carotid studies and echocardiogram will not be repeated. Continue to monitor on telemetry as he may have some underlying dysrhythmia given his recent syncopal spell and frequent ectopy on monitor. Continue aspirin and await Neuro comment regarding transition him to Plavix or Aggrenox. Encouraged smoking cessation. TC 165 TG 62 HDL 77 LDL 76 - This is perfect cholesterol and this CVA seems more HTN in nature. [place] 06/17/11 B Carotid stenosis ie 1-50%. Not enough to cause syncope.  2D ECHO- Left ventricle: The cavity size was normal. Wall thickness was increased in a pattern of mild LVH. Systolic function was normal. The estimated ejection fraction was in the range of 55% to 60%. Wall motion was normal; there were no regional wall motion abnormalities. Left ventricular diastolic function parameters were normal. - Mitral  valve: Mild regurgitation. - Pulmonary arteries: PA peak pressure: 33mm Hg (S).   2. HTN - B/P: is fine this am. Titrate as able.  D/C Celebrex. 3. Elevated CK - CK improving.  Trop I (-) 4. History of syncope- consider cardiac evaluation for underlying arrhythmia 5. Nicotine abuse via pipe smoking- strongly encourage smoking cessation. He does not feel that he needs a nicotine patch at this point. He is interested in Chantix upon discharge 6. Currently a little anemic - follow. 7. Anticipate D/C tomorrow.  DVT Prophylaxis   Home med list:  1) Altace 10 Mg Caps (Ramipril) .... Take one capsule daily  2) Aspirin 81 Mg Ec Tab (Aspirin) .... Take one (1) tablet by mouth daily  3) C-500 Tabs (  Ascorbic acid tabs) .Marland Kitchen.. 1 tablet by mouth once a day  4) Celebrex 200 Mg Caps (Celecoxib) .... Take one capsule daily  5) Lyrica 75 Mg Caps (Pregabalin) .... Take one tablet qhs prn (rare use)  6) Multivitamins Tabs (Multiple vitamin) .... Take one tablet by mouth every day  7) B Complex Vitamins Caps (B complex vitamins) .... Take one tablet by mouth 4 x week      LOS: 1 day   Aliany Fiorenza M 07/01/2011, 7:54 AM

## 2011-07-01 NOTE — Progress Notes (Signed)
Stroke Team Progress Note  HISTORY Philip Ward is an 71 y.o. male who developed sudden onset slurred speech and peri-oral numbness 06/30/2011. He had no other focal neurological deficits.   Patient was not a TPA candidate secondary to mild symptoms. He was admitted for further evaluation and treatment.  SUBJECTIVE His wife is at the bedside. Overall he feels the condition is improving. Has been working with the ST.  OBJECTIVE Most recent Vital Signs: Filed Vitals:   07/01/11 0400 07/01/11 0600 07/01/11 0825 07/01/11 0958  BP: 131/67 134/75 116/63 133/55  Pulse: 50 50 54 50  Temp: 97.8 F (36.6 C) 98.2 F (36.8 C) 97.9 F (36.6 C) 98.2 F (36.8 C)  TempSrc: Oral Oral    Resp: 18 18 20 16   Height:      Weight:      SpO2: 98% 97% 97% 97%   CBG (last 3)  Basename 06/30/11 1528  GLUCAP 95   Intake/Output from previous day: 04/10 0701 - 04/11 0700 In: -  Out: 900 [Urine:900]  IV Fluid Intake:     . sodium chloride 20 mL (06/30/11 2200)   MEDICATIONS    . aspirin EC  325 mg Oral Daily  . enoxaparin  40 mg Subcutaneous Q24H  . mulitivitamin with minerals  1 tablet Oral Daily  . ramipril  10 mg Oral Daily  . DISCONTD: aspirin  300 mg Rectal Daily   PRN:  ondansetron (ZOFRAN) IV, senna-docusate  Diet:  Cardiac thin liquids Activity:   Bathroom privileges DVT Prophylaxis:  Lovenox 40 mg sq daily   CLINICALLY SIGNIFICANT STUDIES CBC    Component Value Date/Time   WBC 4.0 07/01/2011 0600   RBC 3.45* 07/01/2011 0600   HGB 11.0* 07/01/2011 0600   HCT 32.3* 07/01/2011 0600   PLT 153 07/01/2011 0600   MCV 93.6 07/01/2011 0600   MCH 31.9 07/01/2011 0600   MCHC 34.1 07/01/2011 0600   RDW 13.2 07/01/2011 0600   LYMPHSABS 1.7 06/30/2011 1450   MONOABS 0.6 06/30/2011 1450   EOSABS 0.1 06/30/2011 1450   BASOSABS 0.0 06/30/2011 1450   CMP    Component Value Date/Time   NA 141 07/01/2011 0600   K 3.9 07/01/2011 0600   CL 107 07/01/2011 0600   CO2 24 07/01/2011 0600   GLUCOSE  93 07/01/2011 0600   BUN 20 07/01/2011 0600   CREATININE 0.95 07/01/2011 0600   CALCIUM 8.7 07/01/2011 0600   PROT 7.0 06/30/2011 1450   ALBUMIN 4.0 06/30/2011 1450   AST 36 06/30/2011 1450   ALT 22 06/30/2011 1450   ALKPHOS 68 06/30/2011 1450   BILITOT 0.5 06/30/2011 1450   GFRNONAA 82* 07/01/2011 0600   GFRAA >90 07/01/2011 0600   COAGS Lab Results  Component Value Date   INR 0.98 06/30/2011   Lipid Panel    Component Value Date/Time   CHOL 165 07/01/2011 0600   TRIG 62 07/01/2011 0600   HDL 77 07/01/2011 0600   CHOLHDL 2.1 07/01/2011 0600   VLDL 12 07/01/2011 0600   LDLCALC 76 07/01/2011 0600   HgbA1C  No results found for this basename: HGBA1C   Cardiac Panel (last 3 results)  Basename 07/01/11 0600 06/30/11 2222 06/30/11 1450  CKTOTAL 302* 421* 608*  CKMB 3.4 4.6* 6.6*  TROPONINI <0.30 <0.30 <0.30  RELINDX 1.1 1.1 1.1   Urinalysis No results found for this basename: colorurine, appearanceur, labspec, phurine, glucoseu, hgbur, bilirubinur, ketonesur, proteinur, urobilinogen, nitrite, leukocytesur   Urine Drug Screen  Component Value Date/Time   LABOPIA NONE DETECTED 06/30/2011 1536   COCAINSCRNUR NONE DETECTED 06/30/2011 1536   LABBENZ NONE DETECTED 06/30/2011 1536   AMPHETMU NONE DETECTED 06/30/2011 1536   THCU NONE DETECTED 06/30/2011 1536   LABBARB NONE DETECTED 06/30/2011 1536    Alcohol Level No results found for this basename: eth   CT of the brain  06/30/2011  No acute finding.  No intracranial hemorrhage.  Atrophy and chronic small vessel change.  MRI of the brain  07/01/2011  1.  Focal area of non hemorrhagic infarction involving the posterior left putamen and corona radiata. 2.  Other remote lacunar infarcts are present in the basal ganglia, particular the anterior putamen on the right. 3.  Atrophy and extensive white matter disease.  This likely reflects the sequelae of chronic microvascular ischemia.     MRA of the brain  07/01/2011  1.  Mild small vessel disease, less  than expected given the brain MRI findings. 2.  No significant proximal stenosis, aneurysm, or branch vessel occlusion.    2D Echocardiogram  06/29/2011 EF 55-60% with no source of embolus.   Carotid Doppler  bilat L>R 50% done 1 month ago  CXR  Not ordered   EKG  pending.   Physical Exam  Pleasant elderly Caucasian male not in distress.Awake alert. Afebrile. Head is nontraumatic. Neck is supple without bruit. Hearing is decreased bilaterally. Cardiac exam no murmur or gallop. Lungs are clear to auscultation. Distal pulses are well felt.  Neurological exam : Awake  Alert oriented x 3. Normal speech and language.eye movements full without nystagmus. Face symmetric. Tongue midline. Normal strength, tone, reflexes and coordination. Normal sensation. Gait deferred.  ASSESSMENT [name] is a 71 y.o. 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 Episode of loss of consciousness 1 mo ago, etiology unclear. On aspirin 325 mg orally every day prior to admission. Now on aspirin 325 mg orally every day for secondary stroke prevention. Patient with resultant mild aphasai.  -Hypertension -CAD  Hospital day # 1  TREATMENT/PLAN -Continue aspirin 325 mg orally every day for secondary stroke prevention. -CT angio of the head and neck to look at cerebral vasculature -therapy evals  SHARON BIBY, AVNP, ANP-BC, GNP-BC [facility] Pager: [phone] 07/01/2011 11:31 AM  Dr. Delia Heady, Stroke Center Medical Director, has personally reviewed chart, pertinent data, examined the patient and developed the plan of care.

## 2011-07-01 NOTE — Evaluation (Signed)
Physical Therapy Evaluation Patient Details Name: Philip Ward MRN: [id number] DOB: [birth year] Today's Date: 07/01/2011  Problem List:  Patient Active Problem List  Diagnoses  . Acute cerebrovascular accident  . Dysarthria due to cerebrovascular accident  . Hypertension  . Irregular heartbeat  . History of syncope  . Nicotine abuse    Past Medical History:  Past Medical History  Diagnosis Date  . Hypertension   . Coronary artery disease   . Arthritis    Past Surgical History: History reviewed. No pertinent past surgical history.  PT Assessment/Plan/Recommendation PT Assessment Clinical Impression Statement: 71 y.o. male admitted to [facility] for slurred speech and stroke symptoms.  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  He presents today with deficits in high level dynamic balance and would benefit from f/u for balance PT at an outpatient center.   PT Recommendation/Assessment: Patent does not need any further ACUTE PT services No Skilled PT: All education completed;Patient will have necessary level of assist by caregiver at discharge PT Recommendation Follow Up Recommendations: Outpatient PT (at [facility] for balance training.  ) Equipment Recommended: None recommended by PT  PT Evaluation Precautions/Restrictions  Precautions Precautions: Fall Prior Functioning  Home Living Lives With: Spouse Type of Home: House Home Access: Stairs to enter Secretary/administrator of Steps: 2 Entrance Stairs-Rails: Can reach both Home Layout: Laundry or work area in basement;Two level Alternate Level Stairs-Number of Steps: 14 Alternate Level Stairs-Rails: Left Bathroom Shower/Tub: Tub/shower unit;Door [employer] Toilet: Standard Bathroom Accessibility: Yes How Accessible: Accessible via walker Home Adaptive Equipment: Walker - rolling;Crutches;Reacher;Straight cane Prior Function Level of  Independence: Independent Able to Take Stairs?: Yes Driving: Yes Vocation: Part time employment Cognition Cognition Arousal/Alertness: Awake/alert Overall Cognitive Status: Appears within functional limits for tasks assessed Orientation Level: Oriented X4 Sensation/Coordination Sensation Light Touch: Appears Intact Additional Comments: The patient does report some C6 L sided numbness when he turns his head to the left.  He is wondering if it isn't arterial occlusion.  I also mentioned cervical nerve root compression.   Coordination Gross Motor Movements are Fluid and Coordinated: Yes Fine Motor Movements are Fluid and Coordinated: Yes (opening tooth paste) Extremity Assessment RUE Assessment RUE Assessment: Within Functional Limits LUE Assessment LUE Assessment: Within Functional Limits RLE Assessment RLE Assessment: Within Functional Limits LLE Assessment LLE Assessment: Within Functional Limits Mobility (including Balance) Bed Mobility Bed Mobility: Yes Supine to Sit: 7: Independent Transfers Sit to Stand: 5: Supervision;From bed Sit to Stand Details (indicate cue type and reason): supervision for safety secondary to reports of staggering when he is up Stand to Sit: 6: Modified independent (Device/Increase time);To bed Ambulation/Gait Ambulation/Gait: Yes Ambulation/Gait Assistance: 5: Supervision Ambulation/Gait Assistance Details (indicate cue type and reason): supervision.  The patient had one significat LOB while walking and talking with his head turned to the left.  He was able to recover independently without outside assistance, but that is in a flat floor, controlled environment of the hospital.   Ambulation Distance (Feet): 300 Feet Assistive device: None Gait Pattern: Step-through pattern Gait velocity: WNL Stairs: Yes Stairs Assistance: 5: Supervision Stairs Assistance Details (indicate cue type and reason): supervision for safety Stair Management Technique: No  rails;Alternating pattern;Forwards Number of Stairs: 10  Height of Stairs: 6  (4-6" stairs in 3000 gym)  Berg Balance Test Sit to Stand: Able to stand without using hands and stabilize independently Standing Unsupported: Able to stand safely 2 minutes Sitting with Back  Unsupported but Feet Supported on Floor or Stool: Able to sit safely and securely 2 minutes Stand to Sit: Sits safely with minimal use of hands Transfers: Able to transfer safely, minor use of hands Standing Unsupported with Eyes Closed: Able to stand 10 seconds safely Standing Ubsupported with Feet Together: Able to place feet together independently and stand 1 minute safely From Standing, Reach Forward with Outstretched Arm: Can reach confidently >25 cm (10") From Standing Position, Pick up Object from Floor: Able to pick up shoe safely and easily From Standing Position, Turn to Look Behind Over each Shoulder: Looks behind from both sides and weight shifts well Turn 360 Degrees: Able to turn 360 degrees safely in 4 seconds or less Standing Unsupported, Alternately Place Feet on Step/Stool: Able to stand independently and safely and complete 8 steps in 20 seconds Standing Unsupported, One Foot in Front: Able to plae foot ahead of the other independently and hold 30 seconds Standing on One Leg: Able to lift leg independently and hold > 10 seconds Total Score: 55/56 Dynamic Gait Index Level Surface: Normal Change in Gait Speed: Normal Gait with Horizontal Head Turns: Mild Impairment Gait with Vertical Head Turns: Normal Gait and Pivot Turn: Normal Step Over Obstacle: Normal Step Around Obstacles: Normal Steps: Normal Total Score: 23/24 Exercise  Other Exercises Other Exercises: gave patient balance HEP: tandem stand, tandem stand on pillow, single leg stand with horizontal head turns, single leg stand with horizontal head turns (see copy of HEP in paper chart). End of Session PT - End of Session Activity Tolerance:  Patient tolerated treatment well Patient left: in bed;with call bell in reach;with family/visitor present (wife in room) Nurse Communication: Mobility status for ambulation (that patient is safe to walk with family.  ) General Behavior During Session: [facility] for tasks performed Cognition: [facility] for tasks performed Patient Class: Inpatient   Lurena Joiner B. Laramie Gelles, PT, DPT [phone] 07/01/2011, 3:50 PM

## 2011-07-01 NOTE — Evaluation (Signed)
Speech Language Pathology Evaluation Patient Details Name: Philip Ward MRN: [id number] DOB: 02-14-1941 Today's Date: 07/01/2011   HPI:  71 yr old admitted with slurred speech and found to have left infarct and old basal ganglia and right putamen infarct.  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    Problem List:  Patient Active Problem List  Diagnoses  . Acute cerebrovascular accident  . Dysarthria due to cerebrovascular accident  . Hypertension  . Irregular heartbeat  . History of syncope  . Nicotine abuse   Past Medical History:  Past Medical History  Diagnosis Date  . Hypertension   . Coronary artery disease   . Arthritis    Past Surgical History: History reviewed. No pertinent past surgical history.  SLP Assessment/Plan/Recommendation Assessment Clinical Impression Statement: Pt. exhibits mild dysarthria characterized by mild distortion of fricative phonemes, however, speech is 98-100% intelligible in conversation.  Discussed and demonstrated strategies to compensate particularly when in louder environments and when speaking on the telephone.  Language and cognition are WFL's.  No further ST recommended at this time.   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)  Cognition Overall Cognitive Status: Appears within functional limits for tasks assessed Orientation Level: Oriented X4  Comprehension Auditory Comprehension Overall Auditory Comprehension: Appears within functional limits  for tasks assessed Reading Comprehension Reading Status: Not tested  Expression Verbal Expression Overall Verbal Expression: Appears within functional limits for tasks assessed Pragmatics: No impairment Non-Verbal Means of Communication: Not applicable Written Expression Written Expression: Not tested  Oral/Motor Oral Motor/Sensory Function Overall Oral Motor/Sensory Function: Appears within functional limits for tasks assessed (No focal weakness was evident) Motor Speech Overall Motor Speech: Appears within functional limits for tasks assessed Intelligibility: Intelligibility reduced Word:  (100%) Phrase:  (100%) Sentence:  (100%) Conversation:  (98-100% distorted fricatives (mild)) Motor Planning: Witnin functional limits Effective Techniques: Over-articulate  Philip Ward Philip Ward M.Ed [employer] [phone]  07/01/2011

## 2011-07-01 NOTE — Evaluation (Signed)
Occupational Therapy Evaluation Patient Details Name: Philip Ward MRN: [id number] DOB: 1940-07-11 Today's Date: 07/01/2011  Problem List:  Patient Active Problem List  Diagnoses  . Acute cerebrovascular accident  . Dysarthria due to cerebrovascular accident  . Hypertension  . Irregular heartbeat  . History of syncope  . Nicotine abuse    Past Medical History:  Past Medical History  Diagnosis Date  . Hypertension   . Coronary artery disease   . Arthritis    Past Surgical History: History reviewed. No pertinent past surgical history.  OT Assessment/Plan/Recommendation OT Assessment Clinical Impression Statement: 71 yo male s/p left infarct and old basal ganglia and right putamen infarct. Pt with high level balance deficits that could benefit from skilled OT acutely OT Recommendation/Assessment: Patient will need skilled OT in the acute care venue OT Problem List: Decreased activity tolerance;Impaired balance (sitting and/or standing);Decreased knowledge of use of DME or AE;Decreased knowledge of precautions OT Therapy Diagnosis : Generalized weakness OT Plan OT Frequency: Min 2X/week OT Treatment/Interventions: Self-care/ADL training;DME and/or AE instruction;Therapeutic activities;Balance training;Patient/family education OT Recommendation Follow Up Recommendations: No OT follow up Equipment Recommended: None recommended by OT Individuals Consulted Consulted and Agree with Results and Recommendations: Patient;Family member/caregiver OT Goals Acute Rehab OT Goals OT Goal Formulation: With patient/family Time For Goal Achievement: 7 days ADL Goals Pt Will Perform Tub/Shower Transfer: Tub transfer;with modified independence;Ambulation ADL Goal: Tub/Shower Transfer - Progress: Goal set today Miscellaneous OT Goals Miscellaneous OT Goal #1: Pt will complete Berg with score 50 or greater to demonstrate decreased fall risk with ADLs OT Goal: Miscellaneous Goal #1 -  Progress: Goal set today  OT Evaluation Precautions/Restrictions  Precautions Precautions: Fall Restrictions Weight Bearing Restrictions: No Prior Functioning Home Living Lives With: Spouse Type of Home: House Home Access: Stairs to enter [employer] of Steps: 2 Entrance Stairs-Rails: Can reach both Home Layout: One level Bathroom Shower/Tub: Tub/shower unit;Door [employer] Toilet: Standard Bathroom Accessibility: Yes How Accessible: Accessible via walker Home Adaptive Equipment: Walker - rolling;Crutches;Reacher;Straight cane Prior Function Level of Independence: Independent Able to Take Stairs?: Yes Driving: Yes Vocation: Part time employment  ADL ADL Eating/Feeding: Simulated;Supervision/safety Where Assessed - Eating/Feeding: Edge of bed Grooming: Performed;Teeth care;Supervision/safety Where Assessed - Grooming: Standing at sink Lower Body Dressing: Simulated;Supervision/safety Lower Body Dressing Details (indicate cue type and reason): pt able to cross BIL le demonstrating ability to perform at EOB Where Assessed - Lower Body Dressing: Sitting, chair;Unsupported Toilet Transfer: Research scientist (life sciences) Method: Proofreader: Regular height toilet Toileting - Clothing Manipulation: Performed;Supervision/safety Where Assessed - Toileting Clothing Manipulation: Sit to stand from 3-in-1 or toilet Toileting - Hygiene: Performed;Supervision/safety Where Assessed - Toileting Hygiene: Sit to stand from 3-in-1 or toilet Ambulation Related to ADLs: Pt ambulated ~200 ft Min guard with x2 LOB during session. Pt with high level balance deficits noted. OT called PT Chari Manning) to inform of deficits with balance. Pt fall risk at this time ADL Comments: Pt demonstrates balance deficits and expressive aphasia.  Vision/Perception  Vision - History Baseline Vision: Wears glasses all the time Patient Visual Report: No change from  baseline Cognition Cognition Arousal/Alertness: Awake/alert Overall Cognitive Status: Appears within functional limits for tasks assessed Orientation Level: Oriented X4 Sensation/Coordination Sensation Light Touch: Appears Intact Coordination Gross Motor Movements are Fluid and Coordinated: Yes Fine Motor Movements are Fluid and Coordinated: Yes (opening tooth paste) Extremity Assessment RUE Assessment RUE Assessment: Within Functional Limits LUE Assessment LUE Assessment: Within Functional Limits Mobility  Bed Mobility Bed Mobility: Yes Supine to  Sit: 6: Modified independent (Device/Increase time);HOB flat Transfers Transfers: Yes Sit to Stand: Other (comment);With upper extremity assist;From bed (min guard (A)) Stand to Sit: To bed;Other (comment) (min guard (A)) Exercises   End of Session OT - End of Session Equipment Utilized During Treatment: Gait belt Activity Tolerance: Patient tolerated treatment well Patient left: in bed;with call bell in reach Nurse Communication: Mobility status for transfers;Mobility status for ambulation General Behavior During Session: [facility] for tasks performed Cognition: [facility] for tasks performed   Lucile Shutters 07/01/2011, 2:28 PM  Pager: [phone]

## 2011-07-02 ENCOUNTER — Encounter (HOSPITAL_COMMUNITY): Payer: Self-pay | Admitting: Internal Medicine

## 2011-07-02 DIAGNOSIS — R001 Bradycardia, unspecified: Secondary | ICD-10-CM

## 2011-07-02 DIAGNOSIS — R55 Syncope and collapse: Secondary | ICD-10-CM

## 2011-07-02 DIAGNOSIS — I498 Other specified cardiac arrhythmias: Secondary | ICD-10-CM

## 2011-07-02 LAB — CBC
Hemoglobin: 11 g/dL — ABNORMAL LOW (ref 13.0–17.0)
MCH: 30.7 pg (ref 26.0–34.0)
MCV: 92.2 fL (ref 78.0–100.0)
Platelets: 154 10*3/uL (ref 150–400)
RBC: 3.58 MIL/uL — ABNORMAL LOW (ref 4.22–5.81)

## 2011-07-02 LAB — BASIC METABOLIC PANEL
BUN: 21 mg/dL (ref 6–23)
CO2: 24 mEq/L (ref 19–32)
Calcium: 8.8 mg/dL (ref 8.4–10.5)
Creatinine, Ser: 1.04 mg/dL (ref 0.50–1.35)
Glucose, Bld: 97 mg/dL (ref 70–99)

## 2011-07-02 MED ORDER — TRAMADOL HCL 50 MG PO TABS: 50.0000 mg | ORAL_TABLET | Freq: Three times a day (TID) | ORAL | Status: AC | PRN

## 2011-07-02 MED ORDER — AMLODIPINE BESYLATE 2.5 MG PO TABS
2.5000 mg | ORAL_TABLET | Freq: Every day | ORAL | Status: DC
  Administered 2011-07-02: 2.5 mg via ORAL
  Filled 2011-07-02: qty 1

## 2011-07-02 MED ORDER — PREGABALIN 75 MG PO CAPS: 75.0000 mg | ORAL_CAPSULE | Freq: Two times a day (BID) | ORAL | Status: DC | PRN

## 2011-07-02 MED ORDER — AMLODIPINE BESYLATE 2.5 MG PO TABS: 2.5000 mg | ORAL_TABLET | Freq: Every day | ORAL | Status: DC

## 2011-07-02 NOTE — Discharge Summary (Signed)
Physician Discharge Summary  DISCHARGE SUMMARY   Patient ID: Philip Ward MR#: [id number] DOB/AGE: 72/05/1940 72 y.o.   Attending Physician:Philip Ward  Patient's WUJ:WJXBJ,YNWG M, MD, MD  Consults:Treatment Team:  [name], MDNeuro, Cards/EP  Admit date: 06/30/2011 Discharge date: 07/02/2011  Discharge Diagnoses:  Principal Problem:  *Acute cerebrovascular accident Active Problems:  Dysarthria due to cerebrovascular accident  Hypertension  Irregular heartbeat  History of syncope  Nicotine abuse  Bradycardia   Patient Active Problem List  Diagnoses  . Acute cerebrovascular accident  . Dysarthria due to cerebrovascular accident  . Hypertension  . Irregular heartbeat  . History of syncope  . Nicotine abuse  . Bradycardia   Past Medical History  Diagnosis Date  . Hypertension   . Coronary artery disease   . Arthritis   . Bradycardia   . Premature atrial contractions   . Syncope   . CVA (cerebral infarction)    HTN, High frequency hearing loss, H/O Anemia, OA, Mild BPH, Bladder Cancer - Dr Annabell Howells - S/P 5 BCG Rxs  Lacerated cornea (1947)  Herniated disc (L4-L5)  DJD  Syncope 06/10/11 - -Vasovagal  Surgical History Polypectomy  I&D  Repair corneal laceration, OS (1946)  Discharged Condition: Stale   Discharge Medications: Medication List  As of 07/02/2011  4:29 PM   STOP taking these medications         celecoxib 200 MG capsule         TAKE these medications         amLODipine 2.5 MG tablet   Commonly known as: NORVASC   Take 1 tablet (2.5 mg total) by mouth daily.      aspirin EC 325 MG tablet   Take 325 mg by mouth daily.      b complex vitamins tablet   Take 1 tablet by mouth every other day.      mulitivitamin with minerals Tabs   Take 1 tablet by mouth daily.      pregabalin 75 MG capsule   Commonly known as: LYRICA   Take 1 capsule (75 mg total) by mouth 2 (two) times daily as needed.      ramipril 10 MG capsule   Commonly known as: ALTACE   Take 10 mg by mouth daily.      traMADol 50 MG tablet   Commonly known as: ULTRAM   Take 1 tablet (50 mg total) by mouth every 8 (eight) hours as needed for pain.      vitamin C 500 MG tablet   Commonly known as: ASCORBIC ACID   Take 500 mg by mouth daily.            Hospital Procedures: Ct Angio Head W/cm &/or Wo Cm  07/02/2011  *RADIOLOGY REPORT*  Clinical Data:  Evaluate stroke.  CT ANGIOGRAPHY HEAD AND NECK  Technique:  Multidetector CT imaging of the head and neck was performed using the standard protocol during bolus administration of intravenous contrast.  Multiplanar CT image reconstructions including MIPs were obtained to evaluate the vascular anatomy. Carotid stenosis measurements (when applicable) are obtained utilizing NASCET criteria, using the distal internal carotid diameter as the denominator.  Contrast: 50mL OMNIPAQUE IOHEXOL 350 MG/ML SOLN  Comparison:  MR brain and MR angiogram 06/30/2011.  Head CT 06/30/2011.  CTA NECK  Findings:  Fatty 7.9 x 6.8 x 3.6 mm lesion deep to the right trapezius at the upper thoracic region.  There is minimal haziness but without significant irregularity and this may represent a  lipoma.  Low grade liposarcoma not entirely excluded.  Stability can be confirmed on follow-up.  Biapical pleural thickening without associated bony destruction.  Cervical spondylotic changes with various degrees of spinal stenosis, cord flattening and foraminal narrowing.  Right upper radicular cyst posterior to the premolar.  Aortic arch with significant atherosclerotic type changes and ectasia/aneurysmal dilation incompletely assessed on the present exam.  Mild narrowing of the origin of the great vessels.  Plaque right common carotid artery with mild narrowing most notable proximally.  No significant narrowing or irregularity of the proximal right internal carotid artery.  Distal vertical cervical segment of the right internal carotid artery  with calcified plaque and less than 50% diameter stenosis.  Portion of the left common carotid artery is not well delineated secondary to streak artifact from injected contrast.  Plaque noted throughout the course of the left common carotid artery.  Separate from the area of artifact mild narrowing without high-grade stenosis.  Left carotid bifurcation plaque with mild narrowing without hemodynamically significant stenosis.  Artifact extends through portion of the proximal left internal carotid artery.  Prominent plaque left external carotid artery.  Scattered plaque left subclavian artery with mild narrowing.  Mild to slightly moderate narrowing origin of the left vertebral artery.  Mild to slightly moderate narrowing proximal right subclavian artery.  Mild to slightly moderate narrowing proximal right vertebral artery.   Review of the MIP images confirms the above findings.  IMPRESSION: No evidence of hemodynamically significant stenosis of the proximal internal carotid artery on either side.  Calcified plaque with less than 50% diameter stenosis distal vertical cervical segment of the right internal carotid artery.  Mild to slightly moderate narrowing of the proximal vertebral artery bilaterally.  Mild to slightly moderate narrowing proximal right subclavian artery.  Mild narrowing left subclavian artery.  Mild narrowing origin of the great vessels.  Prominent atherosclerosis with fusiform dilation and irregularity of the aortic arch incompletely assessed on the present exam.  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  Prominent cervical and upper thoracic spondylitic changes.  CTA HEAD  Findings:  Small acute infarct posterior limb left internal capsule.  Moderate small vessel disease type changes.  Global atrophy.  No intracranial hemorrhage.  Prominent perivascular space anterior right lenticular nucleus.  No intracranial mass or abnormal enhancement.   Calcification of the cavernous segment of the internal carotid artery bilaterally with mild narrowing bilaterally.  Mild narrowing proximal right middle cerebral artery branch vessel. Mid to distal middle cerebral artery branch vessel irregularity bilaterally.  No significant stenosis of the vertebral arteries or basilar artery.  Mild posterior cerebral artery branch vessel irregularity.  No aneurysm or vascular malformation.   Review of the MIP images confirms the above findings.  IMPRESSION: Mild intracranial atherosclerotic type changes involving cavernous segment of the internal carotid arteries bilaterally, middle cerebral artery branch vessels and less so posterior cerebral artery branch vessels.  Small acute infarct posterior limb left internal capsule.  No associated hemorrhage.  Original Report Authenticated By: Fuller Canada, Ward.D.   Ct Head Wo Contrast  06/30/2011  *RADIOLOGY REPORT*  Clinical Data: Slurred speech.  Right facial numbness.  Dysarthria.  CT HEAD WITHOUT CONTRAST  Technique:  Contiguous axial images were obtained from the base of the skull through the vertex without contrast.  Comparison: None.  Findings: The brain shows mild age related atrophy.  There is an old lacunar infarction in the right basal ganglia/anterior limb  internal capsule.  There is mild chronic small vessel change within the hemispheric white matter.  No identifiably acute infarction, mass lesion, hemorrhage, hydrocephalus or extra-axial collection. There is atherosclerotic calcification of the major vessels at the base of the brain.  The calvarium is unremarkable.  Sinuses, middle ears and mastoids are clear.  Critical Value/emergent results were called by telephone at the time of interpretation on 06/30/2011  at 1510 hours  to  Dr. Karma Ganja, who verbally acknowledged these results.  IMPRESSION: No acute finding.  No intracranial hemorrhage.  Atrophy and chronic small vessel change.  Original Report Authenticated By:  Thomasenia Sales, Ward.D.   Ct Angio Neck W/cm &/or Wo/cm  07/02/2011  *RADIOLOGY REPORT*  Clinical Data:  Evaluate stroke.  CT ANGIOGRAPHY HEAD AND NECK  Technique:  Multidetector CT imaging of the head and neck was performed using the standard protocol during bolus administration of intravenous contrast.  Multiplanar CT image reconstructions including MIPs were obtained to evaluate the vascular anatomy. Carotid stenosis measurements (when applicable) are obtained utilizing NASCET criteria, using the distal internal carotid diameter as the denominator.  Contrast: 50mL OMNIPAQUE IOHEXOL 350 MG/ML SOLN  Comparison:  MR brain and MR angiogram 06/30/2011.  Head CT 06/30/2011.  CTA NECK  Findings:  Fatty 7.9 x 6.8 x 3.6 mm lesion deep to the right trapezius at the upper thoracic region.  There is minimal haziness but without significant irregularity and this may represent a lipoma.  Low grade liposarcoma not entirely excluded.  Stability can be confirmed on follow-up.  Biapical pleural thickening without associated bony destruction.  Cervical spondylotic changes with various degrees of spinal stenosis, cord flattening and foraminal narrowing.  Right upper radicular cyst posterior to the premolar.  Aortic arch with significant atherosclerotic type changes and ectasia/aneurysmal dilation incompletely assessed on the present exam.  Mild narrowing of the origin of the great vessels.  Plaque right common carotid artery with mild narrowing most notable proximally.  No significant narrowing or irregularity of the proximal right internal carotid artery.  Distal vertical cervical segment of the right internal carotid artery with calcified plaque and less than 50% diameter stenosis.  Portion of the left common carotid artery is not well delineated secondary to streak artifact from injected contrast.  Plaque noted throughout the course of the left common carotid artery.  Separate from the area of artifact mild narrowing without  high-grade stenosis.  Left carotid bifurcation plaque with mild narrowing without hemodynamically significant stenosis.  Artifact extends through portion of the proximal left internal carotid artery.  Prominent plaque left external carotid artery.  Scattered plaque left subclavian artery with mild narrowing.  Mild to slightly moderate narrowing origin of the left vertebral artery.  Mild to slightly moderate narrowing proximal right subclavian artery.  Mild to slightly moderate narrowing proximal right vertebral artery.   Review of the MIP images confirms the above findings.  IMPRESSION: No evidence of hemodynamically significant stenosis of the proximal internal carotid artery on either side.  Calcified plaque with less than 50% diameter stenosis distal vertical cervical segment of the right internal carotid artery.  Mild to slightly moderate narrowing of the proximal vertebral artery bilaterally.  Mild to slightly moderate narrowing proximal right subclavian artery.  Mild narrowing left subclavian artery.  Mild narrowing origin of the great vessels.  Prominent atherosclerosis with fusiform dilation and irregularity of the aortic arch incompletely assessed on the present exam.  Fatty lesion deep to the right to the trapezius muscle suggestive of a lipoma  as discussed above.  Prominent cervical and upper thoracic spondylitic changes.  CTA HEAD  Findings:  Small acute infarct posterior limb left internal capsule.  Moderate small vessel disease type changes.  Global atrophy.  No intracranial hemorrhage.  Prominent perivascular space anterior right lenticular nucleus.  No intracranial mass or abnormal enhancement.  Calcification of the cavernous segment of the internal carotid artery bilaterally with mild narrowing bilaterally.  Mild narrowing proximal right middle cerebral artery branch vessel. Mid to distal middle cerebral artery branch vessel irregularity bilaterally.  No significant stenosis of the vertebral  arteries or basilar artery.  Mild posterior cerebral artery branch vessel irregularity.  No aneurysm or vascular malformation.   Review of the MIP images confirms the above findings.  IMPRESSION: Mild intracranial atherosclerotic type changes involving cavernous segment of the internal carotid arteries bilaterally, middle cerebral artery branch vessels and less so posterior cerebral artery branch vessels.  Small acute infarct posterior limb left internal capsule.  No associated hemorrhage.  Original Report Authenticated By: Fuller Canada, Ward.D.   Mr Brain Wo Contrast  07/01/2011  *RADIOLOGY REPORT*  Clinical Data:  Acute onset of slurred speech.  Right-sided facial numbness.  MRI HEAD WITHOUT CONTRAST MRA HEAD WITHOUT CONTRAST  Technique:  Multiplanar, multiecho pulse sequences of the brain and surrounding structures were obtained without intravenous contrast. Angiographic images of the head were obtained using MRA technique without contrast.  Comparison:  CT head without contrast 06/30/2011.  MRI HEAD  Findings:  A focal area of restricted diffusion in the posterior left putamen and extending superiorly within the corona radiata measures 8 mm in maximal axial dimension.  The T2 and FLAIR hyperintensities associated.  Multiple other T2 and FLAIR hyperintensities are present in the periventricular and subcortical white matter bilaterally.  Flow is present in the major intracranial arteries.  The ventricles are proportionate to the degree of atrophy.  No significant extra-axial fluid collection is present.  The globes and orbits are intact.  The paranasal sinuses and mastoid air cells are clear.  IMPRESSION:  1.  Focal area of non hemorrhagic infarction involving the posterior left putamen and corona radiata. 2.  Other remote lacunar infarcts are present in the basal ganglia, particular the anterior putamen on the right. 3.  Atrophy and extensive white matter disease.  This likely reflects the sequelae of chronic  microvascular ischemia.  MRA HEAD  Findings: The internal carotid arteries are within normal limits from the high cervical segments through the ICA termini.  The A1 and M1 segments are normal.  No definite anterior communicating artery is identified.  There is some tapering of distal MCA branch vessels.  The proximal ACA and MCA branch vessels are within normal limits.  The left vertebral artery is slightly dominant to the right.  The PICA origins are below the field of view bilaterally.  The basilar artery is within normal limits.  Both posterior cerebral arteries originate from the basilar tip.  There is some attenuation of distal PCA branch vessels.  IMPRESSION:  1.  Mild small vessel disease, less than expected given the brain MRI findings. 2.  No significant proximal stenosis, aneurysm, or branch vessel occlusion.  Original Report Authenticated By: Jamesetta Orleans. MATTERN, Ward.D.   Mr Maxine Glenn Head/brain Wo Cm  07/01/2011  *RADIOLOGY REPORT*  Clinical Data:  Acute onset of slurred speech.  Right-sided facial numbness.  MRI HEAD WITHOUT CONTRAST MRA HEAD WITHOUT CONTRAST  Technique:  Multiplanar, multiecho pulse sequences of the brain and surrounding structures were  obtained without intravenous contrast. Angiographic images of the head were obtained using MRA technique without contrast.  Comparison:  CT head without contrast 06/30/2011.  MRI HEAD  Findings:  A focal area of restricted diffusion in the posterior left putamen and extending superiorly within the corona radiata measures 8 mm in maximal axial dimension.  The T2 and FLAIR hyperintensities associated.  Multiple other T2 and FLAIR hyperintensities are present in the periventricular and subcortical white matter bilaterally.  Flow is present in the major intracranial arteries.  The ventricles are proportionate to the degree of atrophy.  No significant extra-axial fluid collection is present.  The globes and orbits are intact.  The paranasal sinuses and mastoid  air cells are clear.  IMPRESSION:  1.  Focal area of non hemorrhagic infarction involving the posterior left putamen and corona radiata. 2.  Other remote lacunar infarcts are present in the basal ganglia, particular the anterior putamen on the right. 3.  Atrophy and extensive white matter disease.  This likely reflects the sequelae of chronic microvascular ischemia.  MRA HEAD  Findings: The internal carotid arteries are within normal limits from the high cervical segments through the ICA termini.  The A1 and M1 segments are normal.  No definite anterior communicating artery is identified.  There is some tapering of distal MCA branch vessels.  The proximal ACA and MCA branch vessels are within normal limits.  The left vertebral artery is slightly dominant to the right.  The PICA origins are below the field of view bilaterally.  The basilar artery is within normal limits.  Both posterior cerebral arteries originate from the basilar tip.  There is some attenuation of distal PCA branch vessels.  IMPRESSION:  1.  Mild small vessel disease, less than expected given the brain MRI findings. 2.  No significant proximal stenosis, aneurysm, or branch vessel occlusion.  Original Report Authenticated By: Jamesetta Orleans. MATTERN, Ward.D.    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 Patient states that he was in his usual state of health today while operating when he suddenly developed slurred speech and right facial numbness. He stated that he was talking with his colleagues when he noticed he was having trouble forming words. This occurred at around 1:45 PM. He finished the case and actually noted that he was having a little bit of difficulty with right hand coordination but no apparent weakness. After completing the case he asked the physician if he noticed any change in his speech which was  apparent. He called me-hisprimary care physician who recommended that he go directly to the emergency department. A code stroke was initiated and he was evaluated by head CT that showed no acute intracranial abnormality. Neurology was consulted and felt that he was not a TPA candidate due to Low NIHSS score. Admission to the medical service was recommended.  Slurred speech improved slightly in the ED but he had difficulty forming words. He had no other new neurologic developments.  He had an episode of syncope about 2 weeks ago. States at that time he while operating he had a sudden loss of consciousness. She fell onto the patient. No preceding symptoms. Has no prior history of syncope. He was evaluated by Dr. Timothy Lasso a couple days after this event and underwent an outpatient echocardiogram that showed no significant valvular abnormalities and an ejection fraction of 55-60%. In addition, carotid Dopplers showed  less than 50% stenosis bilaterally. At that time, he increased his aspirin from 81 mg daily 325 mg daily. He does have frequent cardiac ectopy but denies any history of cardiac arrhythmias or prior syncopal spells.    Hospital Course: Admitted with CVA.  MRI c/w Focal area of non hemorrhagic infarction involving the posterior left putamen and corona radiata.  [place]  No more slurred speech. Maybe if he talks fast.  No Numbness or weakness.  Doing better.  Was seen by ST and PT and Dr [name]  B/C when he turned head he passed out - Dr [name] did CT angio. It showed minor issues.  HR currently 39-50 - Sinus Bady with PACs/PVCs.  No Sxs  1. MRI c/w Focal area of non hemorrhagic infarction involving the posterior left putamen and corona radiata = Acute cerebrovascular accident- acute onset dysarthria, right facial droop, and right facial numbness consistent with ischemic CVA - good news is that he is 95+% resolved. Neurology did not feel that he was candidate for TPA given his low NIH stroke  scale score. Recent carotid studies and echocardiogram will not be repeated. He was monitored on telemetry to rule out underlying dysrhythmia given his recent syncopal spell and frequent ectopy/Bradycardia on monitor. We continued ASA and kept it at 325mg  based on Neuro recs.  He was encouraged to smoking cessation.  TC 165 TG 62 HDL 77 LDL 76 - This is perfect cholesterol and this CVA seems more HTN in nature.  [place]  06/17/11 B Carotid stenosis ie 1-50%. Not enough to cause syncope.  2D ECHO- Left ventricle: The cavity size was normal. Wall thickness was increased in a pattern of mild LVH. Systolic function was normal. The estimated ejection fraction was in the range of 55% to 60%. Wall motion was normal; there were no regional wall motion abnormalities. Left ventricular diastolic function parameters were normal. - Mitral valve: Mild regurgitation. - Pulmonary arteries: PA peak pressure: 33mm Hg (S).  Therpy was done yesterday and he did well.  Will bring back to my office soon and decide if outpt Rx necessary.  CTA - Small vessel changes and minimal Blockages noted. Discussed with pt.  Neruo recommended ASA 325.  Will D/c today after lunch after cards eval for Bradycardia.  Dr Marlis Edelson eval today: Philip Ward is a 71 y.o. male with a posterior left putamen and corona radiata infarct secondary to small vessel disease. CT angio of the head and neck without significant findings. Episode of loss of consciousness 1 mo ago, etiology unclear. On aspirin 325 mg orally every day prior to admission. Now on aspirin 325 mg orally every day for secondary stroke prevention. Patient with resultant mild aphasia, numbness resolved.  -Hypertension  -CAD  Hospital day # 2  TREATMENT/PLAN  -Continue aspirin 325 mg orally every day for secondary stroke prevention.  -no further stroke workup  -ok for discharge from stroke standpoint  -Agree with recommendations for OP PT at [facility]  -Stroke  Service will sign off. Follow up with Dr. [name] in 2 months   2. HTN - B/P D/C Celebrex. Add amlodipine 2.5  3. Elevated CK - CK fine last check. Trop I (-)  4. History of syncope and continued HR in low 40s Sinus Huston Foley- Dr Tillie Rung to eval  5. Nicotine abuse via pipe smoking- strongly encourage smoking cessation. He does not feel that he needs a nicotine patch at this point. He is interested in Chantix upon discharge.  Will discuss in 1 week or so.  6. Currently a little anemic - follow.  Ultram for OA no celebrex.  DVT Prophylaxis was provided during hospitalization.  Dr Johney Frame evaluated pt in afternoon 07/02/11 and his recs were: for Syncope- episode of syncope 06/01/11 is of an unclear etiology. I cannot exclude bradycardia as the cause, though it appears that his bradycardia has been long standing and predominantly asymptomatic.  Presently, his heart rate at rest is 50s and without symptoms.  EF is normal. EKG reveals bradycardia and mild lvh. At this point, I would advise an event monitor to evaluate for pauses, profound daytime bradycardias, or other arrhythmias.  He is aware that he should not drive for 6 months.  2. Bradycardia- as above  Event monitor to be placed by my office on Monday  Avoid chronotropic depressants  3. CVA- per neurology  Event monitor will also be helpful to evaluate for asymptomatic afib which could predispose the patient to recurrent stroke  DC to home  Follow-up with me in 5 weeks   He was D/ced in stable condition.     Day of Discharge Exam BP 165/76  Pulse 51  Temp(Src) 97 F (36.1 C) (Oral)  Resp 18  Ht 6' (1.829 Ward)  Wt 70.3 kg (154 lb 15.7 oz)  BMI 21.02 kg/m2  SpO2 93%  Physical Exam: See today's Note.  Discharge Labs:  [facility] 07/02/11 0615 07/01/11 0600  NA 139 141  K 4.0 3.9  CL 107 107  CO2 24 24  GLUCOSE 97 93  BUN 21 20  CREATININE 1.04 0.95  CALCIUM 8.8 8.7  MG -- --  PHOS -- --    Basename 06/30/11 1450  AST 36    ALT 22  ALKPHOS 68  BILITOT 0.5  PROT 7.0  ALBUMIN 4.0    Basename 07/02/11 0615 07/01/11 0600 06/30/11 1450  WBC 4.8 4.0 --  NEUTROABS -- -- 2.8  HGB 11.0* 11.0* --  HCT 33.0* 32.3* --  MCV 92.2 93.6 --  PLT 154 153 --    Basename 07/01/11 1338 07/01/11 0600 06/30/11 2222  CKTOTAL 247* 302* 421*  CKMB 2.9 3.4 4.6*  CKMBINDEX -- -- --  TROPONINI <0.30 <0.30 <0.30    Basename 06/30/11 2222  TSH 3.418  T4TOTAL --  T3FREE --  THYROIDAB --   No results found for this basename: VITAMINB12:2,FOLATE:2,FERRITIN:2,TIBC:2,IRON:2,RETICCTPCT:2 in the last 72 hours Lab Results  Component Value Date   INR 0.98 06/30/2011       Discharge instructions:  01-Home or Self Care Follow-up Information    Follow up with SETHI,PRAMODKUMAR P, MD. Schedule an appointment as soon as possible for a visit in 1 month.   Contact information:   [address], [address] [facility] [place] [address] [phone]       Follow up with Gwen Pounds, MD.   Contact information:   [address] [facility], [place]. [place] [address] [phone]           Disposition: Home  Follow-up Appts: Follow-up with Dr. Timothy Lasso at [facility] in 1-2 weeks.  Call for appointment.  Condition on Discharge: stable  Tests Needing Follow-up: CBC  Time spent in discharge (includes decision making & examination of pt): 29 min  Signed: Naod Sweetland Ward 07/02/2011, 4:29 PM

## 2011-07-02 NOTE — Progress Notes (Signed)
Stroke Team Progress Note  HISTORY Philip Ward is an 71 y.o. male who developed sudden onset slurred speech and peri-oral numbness 06/30/2011. He had no other focal neurological deficits.   Patient was not a TPA candidate secondary to mild symptoms. He was admitted for further evaluation and treatment.  SUBJECTIVE No complaints.Stable  OBJECTIVE Most recent Vital Signs: Filed Vitals:   07/01/11 2200 07/02/11 0201 07/02/11 0617 07/02/11 1000  BP: 174/65 151/71 153/65 165/76  Pulse: 50 50 43 51  Temp: 98 F (36.7 C) 98 F (36.7 C) 97.9 F (36.6 C) 97 F (36.1 C)  TempSrc: Oral Oral Oral Oral  Resp: 18 20 20 18   Height:      Weight:      SpO2: 98% 96% 92% 93%   CBG (last 3)  Basename 06/30/11 1528  GLUCAP 95   Intake/Output from previous day: 04/11 0701 - 04/12 0700 In: 360 [P.O.:360] Out: -   IV Fluid Intake:     . sodium chloride 20 mL (06/30/11 2200)   MEDICATIONS    . amLODipine  2.5 mg Oral Daily  . aspirin EC  325 mg Oral Daily  . enoxaparin  40 mg Subcutaneous Q24H  . mulitivitamin with minerals  1 tablet Oral Daily  . ramipril  10 mg Oral Daily   PRN:  iohexol, ondansetron (ZOFRAN) IV, senna-docusate  Diet:  Cardiac thin liquids Activity:   OOB DVT Prophylaxis:  Lovenox 40 mg sq daily   CLINICALLY SIGNIFICANT STUDIES CBC    Component Value Date/Time   WBC 4.8 07/02/2011 0615   RBC 3.58* 07/02/2011 0615   HGB 11.0* 07/02/2011 0615   HCT 33.0* 07/02/2011 0615   PLT 154 07/02/2011 0615   MCV 92.2 07/02/2011 0615   MCH 30.7 07/02/2011 0615   MCHC 33.3 07/02/2011 0615   RDW 12.9 07/02/2011 0615   LYMPHSABS 1.7 06/30/2011 1450   MONOABS 0.6 06/30/2011 1450   EOSABS 0.1 06/30/2011 1450   BASOSABS 0.0 06/30/2011 1450   CMP    Component Value Date/Time   NA 139 07/02/2011 0615   K 4.0 07/02/2011 0615   CL 107 07/02/2011 0615   CO2 24 07/02/2011 0615   GLUCOSE 97 07/02/2011 0615   BUN 21 07/02/2011 0615   CREATININE 1.04 07/02/2011 0615   CALCIUM 8.8  07/02/2011 0615   PROT 7.0 06/30/2011 1450   ALBUMIN 4.0 06/30/2011 1450   AST 36 06/30/2011 1450   ALT 22 06/30/2011 1450   ALKPHOS 68 06/30/2011 1450   BILITOT 0.5 06/30/2011 1450   GFRNONAA 70* 07/02/2011 0615   GFRAA 81* 07/02/2011 0615   COAGS Lab Results  Component Value Date   INR 0.98 06/30/2011   Lipid Panel    Component Value Date/Time   CHOL 165 07/01/2011 0600   TRIG 62 07/01/2011 0600   HDL 77 07/01/2011 0600   CHOLHDL 2.1 07/01/2011 0600   VLDL 12 07/01/2011 0600   LDLCALC 76 07/01/2011 0600   HgbA1C  Lab Results  Component Value Date   HGBA1C 5.6 07/01/2011   Cardiac Panel (last 3 results)   Basename 07/01/11 1338 07/01/11 0600 06/30/11 2222  CKTOTAL 247* 302* 421*  CKMB 2.9 3.4 4.6*  TROPONINI <0.30 <0.30 <0.30  RELINDX 1.2 1.1 1.1   Urinalysis No results found for this basename: colorurine,  appearanceur,  labspec,  phurine,  glucoseu,  hgbur,  bilirubinur,  ketonesur,  proteinur,  urobilinogen,  nitrite,  leukocytesur   Urine Drug Screen    Component  Value Date/Time   LABOPIA NONE DETECTED 06/30/2011 1536   COCAINSCRNUR NONE DETECTED 06/30/2011 1536   LABBENZ NONE DETECTED 06/30/2011 1536   AMPHETMU NONE DETECTED 06/30/2011 1536   THCU NONE DETECTED 06/30/2011 1536   LABBARB NONE DETECTED 06/30/2011 1536    Alcohol Level No results found for this basename: eth   CT of the brain  06/30/2011  No acute finding.  No intracranial hemorrhage.  Atrophy and chronic small vessel change.  CT angio of the head  07/02/2011 Mild intracranial atherosclerotic type changes involving cavernous segment of the internal carotid arteries bilaterally, middle cerebral artery branch vessels and less so posterior cerebral artery branch vessels.  Small acute infarct posterior limb left internal capsule.  No associated hemorrhage.    CT angio of the neck  07/02/2011 No evidence of hemodynamically significant stenosis of the proximal internal carotid artery on either side.  Calcified plaque with  less than 50% diameter stenosis distal vertical cervical segment of the right internal carotid artery.  Mild to slightly moderate narrowing of the proximal vertebral artery bilaterally.  Mild to slightly moderate narrowing proximal right subclavian artery.  Mild narrowing left subclavian artery.  Mild narrowing origin of the great vessels.  Prominent atherosclerosis with fusiform dilation and irregularity of the aortic arch incompletely assessed on the present exam.  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  Prominent cervical and upper thoracic spondylitic changes.   MRI of the brain  07/01/2011  1.  Focal area of non hemorrhagic infarction involving the posterior left putamen and corona radiata. 2.  Other remote lacunar infarcts are present in the basal ganglia, particular the anterior putamen on the right. 3.  Atrophy and extensive white matter disease.  This likely reflects the sequelae of chronic microvascular ischemia.     MRA of the brain  07/01/2011  1.  Mild small vessel disease, less than expected given the brain MRI findings. 2.  No significant proximal stenosis, aneurysm, or branch vessel occlusion.    2D Echocardiogram  06/29/2011 EF 55-60% with no source of embolus.   Carotid Doppler  bilat L>R 50% done 1 month ago  CXR  Not ordered   EKG  pending.   Physical Exam  Pleasant elderly Caucasian male not in distress.Awake alert. Afebrile. Head is nontraumatic. Neck is supple without bruit. Hearing is decreased bilaterally. Cardiac exam no murmur or gallop. Lungs are clear to auscultation. Distal pulses are well felt.  Neurological exam : Awake  Alert oriented x 3. Normal speech and language.eye movements full without nystagmus. Face symmetric. Tongue midline. Normal strength, tone, reflexes and coordination. Normal sensation. Gait deferred.   ASSESSMENT [name] is a 71 y.o. 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 CT angio of the head and neck without significant findings. Episode of loss of consciousness 1 mo ago, etiology unclear. On aspirin 325 mg orally every day prior to admission. Now on aspirin 325 mg orally every day for secondary stroke prevention. Patient with resultant mild aphasia, numbness resolved.  -Hypertension -CAD  Hospital day # 2  TREATMENT/PLAN -Continue aspirin 325 mg orally every day for secondary stroke prevention. -no further stroke workup -ok for discharge from stroke standpoint -Agree with recommendations for [facility] at [facility] -Stroke Service will sign off. Follow up with Dr. [name] in 2 months.  SHARON BIBY, AVNP, ANP-BC, GNP-BC [facility] Pager:  [phone] 07/02/2011 12:48 PM  Dr. Delia Heady, Stroke Center Medical Director, has personally reviewed chart, pertinent data, examined the patient and developed the plan of care.

## 2011-07-02 NOTE — Consult Note (Signed)
ELECTROPHYSIOLOGY CONSULT NOTE  Primary Care Physician: Gwen Pounds, MD, MD Referring Physician: Dr Timothy Lasso  Admit Date: 06/30/2011  Reason for consultation:  bradycardia  Philip Ward is a 71 y.o. male with a h/o bradycardia and syncope who was admitted with stroke.  He reports being in his usual health state until 06/10/11 when he had abrupt syncope while in the OR.  He states that he was assisting with a shoulder arthroscopy when he abruptly lost consciousness without warning and fell into the surgical field.  He reports that his LOC was only a second or two, though it took him several minutes to recover.  He reports no associated symptoms.  This was his first and only episode of syncope. He is now admitted with abrupt dysarthria and acute CVA.  While on telemetry, he has been observed to have bradycardia with heart rates into the 40s/50s.  He has been asymptomatic with this here. He reports having rare palpitations in the past but denies fatigue, dizziness, or other symptoms (except for the episode of syncope above).  He had an ekg 2/12 which revealed sinus rhythm 55 bpm.  Interestingly following his syncopal event, he had a rhythm strip performed which revealed heart rates of 60 bpm.  Presently, he is resting comfortably and is without complaint.   Past Medical History  Diagnosis Date  . Hypertension   . Coronary artery disease   . Arthritis   . Bradycardia   . Premature atrial contractions   . Syncope   . CVA (cerebral infarction)      . amLODipine  2.5 mg Oral Daily  . aspirin EC  325 mg Oral Daily  . enoxaparin  40 mg Subcutaneous Q24H  . mulitivitamin with minerals  1 tablet Oral Daily  . ramipril  10 mg Oral Daily      . sodium chloride 20 mL (06/30/11 2200)    No Known Allergies  History   Social History  . Marital Status: Married    Spouse Name: N/A    Number of Children: N/A  . Years of Education: N/A   Occupational History  . Not on file.   Social  History Main Topics  . Smoking status: Current Everyday Smoker -- 25 years    Types: Pipe  . Smokeless tobacco: Not on file  . Alcohol Use: 4.2 oz/week    2 Glasses of wine, 5 Cans of beer per week  . Drug Use: Not on file  . Sexually Active: Yes   Other Topics Concern  . 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   FH- denies FH of syncope  ROS- All systems are reviewed and negative except as per the HPI above  Physical Exam: Telemetry: Filed Vitals:   07/01/11 2200 07/02/11 0201 07/02/11 0617 07/02/11 1000  BP: 174/65 151/71 153/65 165/76  Pulse: 50 50 43 51  Temp: 98 F (36.7 C) 98 F (36.7 C) 97.9 F (36.6 C) 97 F (36.1 C)  TempSrc: Oral Oral Oral Oral  Resp: 18 20 20 18   Height:      Weight:      SpO2: 98% 96% 92% 93%    GEN- The patient is well appearing, alert and oriented x 3 today.   Head- normocephalic, atraumatic Eyes-  Sclera clear, conjunctiva pink Ears- hearing intact Oropharynx- clear Neck- supple, no JVP Carotid massage maneuver today is normal Lymph- no cervical lymphadenopathy Lungs- Clear to ausculation bilaterally, normal work of  breathing Heart- Regular rate and rhythm, no murmurs, rubs or gallops, PMI not laterally displaced GI- soft, NT, ND, + BS Extremities- no clubbing, cyanosis, or edema MS- no significant deformity or atrophy Skin- no rash or lesion Psych- euthymic mood, full affect Neuro- strength and sensation are intact without appreciable dysarthria at this time.  EKG-  Sinus bradycardia, LVH, pacs Echo 06/29/11- mild LVH, EF 55-60%, no significant valvular disease  Labs:   Lab Results  Component Value Date   WBC 4.8 07/02/2011   HGB 11.0* 07/02/2011   HCT 33.0* 07/02/2011   MCV 92.2 07/02/2011   PLT 154 07/02/2011    Lab 07/02/11 0615 06/30/11 1450  NA 139 --  K 4.0 --  CL 107 --  CO2 24 --  BUN 21 --  CREATININE 1.04 --  CALCIUM 8.8 --  PROT -- 7.0  BILITOT -- 0.5  ALKPHOS -- 68  ALT -- 22    AST -- 36  GLUCOSE 97 --   Lab Results  Component Value Date   CKTOTAL 247* 07/01/2011   CKMB 2.9 07/01/2011   TROPONINI <0.30 07/01/2011    Lab Results  Component Value Date   CHOL 165 07/01/2011   Lab Results  Component Value Date   HDL 77 07/01/2011   Lab Results  Component Value Date   LDLCALC 76 07/01/2011   Lab Results  Component Value Date   TRIG 62 07/01/2011   Lab Results  Component Value Date   CHOLHDL 2.1 07/01/2011   No results found for this basename: LDLDIRECT      ASSESSMENT AND PLAN:   1.  Syncope- episode of syncope 06/01/11 is of an unclear etiology.  I cannot exclude bradycardia as the cause, though it appears that his bradycardia has been long standing and predominantly asymptomatic. Presently, his heart rate at rest is 50s and without symptoms. EF is normal.  EKG reveals bradycardia and mild lvh.  At this point, I would advise an event monitor to evaluate for pauses, profound daytime bradycardias, or other arrhythmias. He is aware that he should not drive for 6 months.  2. Bradycardia- as above Event monitor to be placed by my office on Monday Avoid chronotropic depressants  3. CVA- per neurology Event monitor will also be helpful to evaluate for asymptomatic afib which could predispose the patient to recurrent stroke  DC to home Follow-up with me in 5 weeks  [name], MD 07/02/2011  4:11 PM

## 2011-07-02 NOTE — Discharge Instructions (Signed)
STROKE/TIA DISCHARGE INSTRUCTIONS SMOKING Cigarette smoking nearly doubles your risk of having a stroke & is the single most alterable risk factor  If you smoke or have smoked in the last 12 months, you are advised to quit smoking for your health.  Most of the excess cardiovascular risk related to smoking disappears within a year of stopping.  Ask you doctor about anti-smoking medications  [place] Quit Line: 1-800-QUIT NOW  Free Smoking Cessation Classes ([phone]  CHOLESTEROL Know your levels; limit fat & cholesterol in your diet  Lipid Panel  No results found for this basename: chol, trig, hdl, cholhdl, vldl, ldlcalc      Many patients benefit from treatment even if their cholesterol is at goal.  Goal: Total Cholesterol (CHOL) less than 160  Goal:  Triglycerides (TRIG) less than 150  Goal:  HDL greater than 40  Goal:  LDL (LDLCALC) less than 100   BLOOD PRESSURE American Stroke Association blood pressure target is less that 120/80 mm/Hg  Your discharge blood pressure is:  BP: 134/75 mmHg  Monitor your blood pressure  Limit your salt and alcohol intake  Many individuals will require more than one medication for high blood pressure  DIABETES (A1c is a blood sugar average for last 3 months) Goal HGBA1c is under 7% (HBGA1c is blood sugar average for last 3 months)  Diabetes: NOT DM   No results found for this basename: HGBA1C     Your HGBA1c can be lowered with medications, healthy diet, and exercise.  Check your blood sugar as directed by your physician  Call your physician if you experience unexplained or low blood sugars.  PHYSICAL ACTIVITY/REHABILITATION Goal is 30 minutes at least 4 days per week    Activity:   Increase activity slowly,  Return to work in 3 weeks. Change vacation plans  Activity decreases your risk of heart attack and stroke and makes your heart stronger.  It helps control your weight and blood pressure; helps you relax and can improve your  mood.  Participate in a regular exercise program.  Talk with your doctor about the best form of exercise for you (dancing, walking, swimming, cycling).  DIET/WEIGHT Goal is to maintain a healthy weight  Your discharge diet is: Cardiac Thin liquids Your height is:  Height: 6' (182.9 cm) Your current weight is: Weight: 70.3 kg (154 lb 15.7 oz) Your Body Mass Index (BMI) is:  BMI (Calculated): 21.1   Following the type of diet specifically designed for you will help prevent another stroke.  Your goal weight range is:  At goal  Your goal Body Mass Index (BMI) is 19-24.  Healthy food habits can help reduce 3 risk factors for stroke:  High cholesterol, hypertension, and excess weight.  RESOURCES Stroke/Support Group:  Call [phone]  they meet the 3rd Sunday of the month on the Rehab Unit at [facility], [place] ( no meetings June, July & Aug).  STROKE EDUCATION PROVIDED/REVIEWED AND GIVEN TO PATIENT Stroke warning signs and symptoms How to activate emergency medical system (call 911). Medications prescribed at discharge. Need for follow-up after discharge. Personal risk factors for stroke. Pneumonia vaccine given:   ? Flu vaccine given:   My questions have been answered, the writing is legible, and I understand these instructions.  I will adhere to these goals & educational materials that have been provided to me after my discharge from the hospital.

## 2011-07-02 NOTE — Progress Notes (Signed)
Subjective: Admitted with CVA. MRI c/w Focal area of non hemorrhagic infarction involving the posterior left putamen and corona radiata. [place] No more slurred speech. Maybe if he talks fast. No Numbness or weakness. Doing better. Was seen by ST and PT and Dr [name]  B/C when he turned head he passed out - Dr [name] did CT angio.  It showed minor issues.  HR currently 39-50 - Sinus Bady with PACs/PVCs. No Sxs   PMHx  HTN, High frequency hearing loss, H/O Anemia, OA, Mild BPH, Bladder Cancer - Dr Annabell Howells - S/P 5 BCG Rxs  Lacerated cornea (1947)  Herniated disc (L4-L5)  DJD  Syncope 06/10/11 - -Vasovagal  Surgical History Polypectomy  I&D  Repair corneal laceration, OS (1946)   Objective: Vital signs in last 24 hours: Temp:  [97.9 F (36.6 C)-98.3 F (36.8 C)] 97.9 F (36.6 C) (04/12 0617) Pulse Rate:  [43-54] 43  (04/12 0617) Resp:  [16-20] 20  (04/12 0617) BP: (114-174)/(55-71) 153/65 mmHg (04/12 0617) SpO2:  [92 %-98 %] 92 % (04/12 0617) Weight change:     CBG (last 3)   Basename 06/30/11 1528  GLUCAP 95    Intake/Output from previous day:  Intake/Output Summary (Last 24 hours) at 07/02/11 0806 Last data filed at 07/01/11 1754  Gross per 24 hour  Intake    360 ml  Output      0 ml  Net    360 ml   04/11 0701 - 04/12 0700 In: 360 [P.O.:360] Out: -    Physical Exam  General appearance: A and oriented OP - Min speech slur o/w back to normal. Eyes: no scleral icterus Throat: oropharynx moist without erythema Resp: CTA Cardio: Reg GI: soft, non-tender; bowel sounds normal; no masses,  no organomegaly Extremities: no clubbing, cyanosis or edema Neuro - back to near baseline.   Lab Results:  Basename 07/02/11 0615 07/01/11 0600  NA 139 141  K 4.0 3.9  CL 107 107  CO2 24 24  GLUCOSE 97 93  BUN 21 20  CREATININE 1.04 0.95  CALCIUM 8.8 8.7  MG -- --  PHOS -- --     Basename 06/30/11 1450  AST 36  ALT 22  ALKPHOS 68  BILITOT 0.5  PROT  7.0  ALBUMIN 4.0     Basename 07/02/11 0615 07/01/11 0600 06/30/11 1450  WBC 4.8 4.0 --  NEUTROABS -- -- 2.8  HGB 11.0* 11.0* --  HCT 33.0* 32.3* --  MCV 92.2 93.6 --  PLT 154 153 --    Lab Results  Component Value Date   INR 0.98 06/30/2011     Basename 07/01/11 1338 07/01/11 0600 06/30/11 2222  CKTOTAL 247* 302* 421*  CKMB 2.9 3.4 4.6*  CKMBINDEX -- -- --  TROPONINI <0.30 <0.30 <0.30     Basename 06/30/11 2222  TSH 3.418  T4TOTAL --  T3FREE --  THYROIDAB --    No results found for this basename: VITAMINB12:2,FOLATE:2,FERRITIN:2,TIBC:2,IRON:2,RETICCTPCT:2 in the last 72 hours  Micro Results: No results found for this or any previous visit (from the past 240 hour(s)).   Studies/Results: Ct Angio Head W/cm &/or Wo Cm  07/02/2011  *RADIOLOGY REPORT*  Clinical Data:  Evaluate stroke.  CT ANGIOGRAPHY HEAD AND NECK  Technique:  Multidetector CT imaging of the head and neck was performed using the standard protocol during bolus administration of intravenous contrast.  Multiplanar CT image reconstructions including MIPs were obtained to evaluate the vascular anatomy. Carotid stenosis measurements (when applicable) are  obtained utilizing NASCET criteria, using the distal internal carotid diameter as the denominator.  Contrast: 50mL OMNIPAQUE IOHEXOL 350 MG/ML SOLN  Comparison:  MR brain and MR angiogram 06/30/2011.  Head CT 06/30/2011.  CTA NECK  Findings:  Fatty 7.9 x 6.8 x 3.6 mm lesion deep to the right trapezius at the upper thoracic region.  There is minimal haziness but without significant irregularity and this may represent a lipoma.  Low grade liposarcoma not entirely excluded.  Stability can be confirmed on follow-up.  Biapical pleural thickening without associated bony destruction.  Cervical spondylotic changes with various degrees of spinal stenosis, cord flattening and foraminal narrowing.  Right upper radicular cyst posterior to the premolar.  Aortic arch with  significant atherosclerotic type changes and ectasia/aneurysmal dilation incompletely assessed on the present exam.  Mild narrowing of the origin of the great vessels.  Plaque right common carotid artery with mild narrowing most notable proximally.  No significant narrowing or irregularity of the proximal right internal carotid artery.  Distal vertical cervical segment of the right internal carotid artery with calcified plaque and less than 50% diameter stenosis.  Portion of the left common carotid artery is not well delineated secondary to streak artifact from injected contrast.  Plaque noted throughout the course of the left common carotid artery.  Separate from the area of artifact mild narrowing without high-grade stenosis.  Left carotid bifurcation plaque with mild narrowing without hemodynamically significant stenosis.  Artifact extends through portion of the proximal left internal carotid artery.  Prominent plaque left external carotid artery.  Scattered plaque left subclavian artery with mild narrowing.  Mild to slightly moderate narrowing origin of the left vertebral artery.  Mild to slightly moderate narrowing proximal right subclavian artery.  Mild to slightly moderate narrowing proximal right vertebral artery.   Review of the MIP images confirms the above findings.  IMPRESSION: No evidence of hemodynamically significant stenosis of the proximal internal carotid artery on either side.  Calcified plaque with less than 50% diameter stenosis distal vertical cervical segment of the right internal carotid artery.  Mild to slightly moderate narrowing of the proximal vertebral artery bilaterally.  Mild to slightly moderate narrowing proximal right subclavian artery.  Mild narrowing left subclavian artery.  Mild narrowing origin of the great vessels.  Prominent atherosclerosis with fusiform dilation and irregularity of the aortic arch incompletely assessed on the present exam.  Fatty lesion deep to the right to  the trapezius muscle suggestive of a lipoma as discussed above.  Prominent cervical and upper thoracic spondylitic changes.  CTA HEAD  Findings:  Small acute infarct posterior limb left internal capsule.  Moderate small vessel disease type changes.  Global atrophy.  No intracranial hemorrhage.  Prominent perivascular space anterior right lenticular nucleus.  No intracranial mass or abnormal enhancement.  Calcification of the cavernous segment of the internal carotid artery bilaterally with mild narrowing bilaterally.  Mild narrowing proximal right middle cerebral artery branch vessel. Mid to distal middle cerebral artery branch vessel irregularity bilaterally.  No significant stenosis of the vertebral arteries or basilar artery.  Mild posterior cerebral artery branch vessel irregularity.  No aneurysm or vascular malformation.   Review of the MIP images confirms the above findings.  IMPRESSION: Mild intracranial atherosclerotic type changes involving cavernous segment of the internal carotid arteries bilaterally, middle cerebral artery branch vessels and less so posterior cerebral artery branch vessels.  Small acute infarct posterior limb left internal capsule.  No associated hemorrhage.  Original Report Authenticated By: Fuller Canada,  M.D.   Ct Head Wo Contrast  06/30/2011  *RADIOLOGY REPORT*  Clinical Data: Slurred speech.  Right facial numbness.  Dysarthria.  CT HEAD WITHOUT CONTRAST  Technique:  Contiguous axial images were obtained from the base of the skull through the vertex without contrast.  Comparison: None.  Findings: The brain shows mild age related atrophy.  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  There is mild chronic small vessel change within the hemispheric white matter.  No identifiably acute infarction, mass lesion, hemorrhage, hydrocephalus or extra-axial collection. There is atherosclerotic calcification of the major vessels at the base  of the brain.  The calvarium is unremarkable.  Sinuses, middle ears and mastoids are clear.  Critical Value/emergent results were called by telephone at the time of interpretation on 06/30/2011  at 1510 hours  to  Dr. Karma Ganja, who verbally acknowledged these results.  IMPRESSION: No acute finding.  No intracranial hemorrhage.  Atrophy and chronic small vessel change.  Original Report Authenticated By: Thomasenia Sales, M.D.   Ct Angio Neck W/cm &/or Wo/cm  07/02/2011  *RADIOLOGY REPORT*  Clinical Data:  Evaluate stroke.  CT ANGIOGRAPHY HEAD AND NECK  Technique:  Multidetector CT imaging of the head and neck was performed using the standard protocol during bolus administration of intravenous contrast.  Multiplanar CT image reconstructions including MIPs were obtained to evaluate the vascular anatomy. Carotid stenosis measurements (when applicable) are obtained utilizing NASCET criteria, using the distal internal carotid diameter as the denominator.  Contrast: 50mL OMNIPAQUE IOHEXOL 350 MG/ML SOLN  Comparison:  MR brain and MR angiogram 06/30/2011.  Head CT 06/30/2011.  CTA NECK  Findings:  Fatty 7.9 x 6.8 x 3.6 mm lesion deep to the right trapezius at the upper thoracic region.  There is minimal haziness but without significant irregularity and this may represent a lipoma.  Low grade liposarcoma not entirely excluded.  Stability can be confirmed on follow-up.  Biapical pleural thickening without associated bony destruction.  Cervical spondylotic changes with various degrees of spinal stenosis, cord flattening and foraminal narrowing.  Right upper radicular cyst posterior to the premolar.  Aortic arch with significant atherosclerotic type changes and ectasia/aneurysmal dilation incompletely assessed on the present exam.  Mild narrowing of the origin of the great vessels.  Plaque right common carotid artery with mild narrowing most notable proximally.  No significant narrowing or irregularity of the proximal right  internal carotid artery.  Distal vertical cervical segment of the right internal carotid artery with calcified plaque and less than 50% diameter stenosis.  Portion of the left common carotid artery is not well delineated secondary to streak artifact from injected contrast.  Plaque noted throughout the course of the left common carotid artery.  Separate from the area of artifact mild narrowing without high-grade stenosis.  Left carotid bifurcation plaque with mild narrowing without hemodynamically significant stenosis.  Artifact extends through portion of the proximal left internal carotid artery.  Prominent plaque left external carotid artery.  Scattered plaque left subclavian artery with mild narrowing.  Mild to slightly moderate narrowing origin of the left vertebral artery.  Mild to slightly moderate narrowing proximal right subclavian artery.  Mild to slightly moderate narrowing proximal right vertebral artery.   Review of the MIP images confirms the above findings.  IMPRESSION: No evidence of hemodynamically significant stenosis of the proximal internal carotid artery on either side.  Calcified plaque with less than 50% diameter stenosis distal vertical cervical segment of the right  internal carotid artery.  Mild to slightly moderate narrowing of the proximal vertebral artery bilaterally.  Mild to slightly moderate narrowing proximal right subclavian artery.  Mild narrowing left subclavian artery.  Mild narrowing origin of the great vessels.  Prominent atherosclerosis with fusiform dilation and irregularity of the aortic arch incompletely assessed on the present exam.  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  Prominent cervical and upper thoracic spondylitic changes.  CTA HEAD  Findings:  Small acute infarct posterior limb left internal capsule.  Moderate small vessel disease type changes.  Global atrophy.  No intracranial hemorrhage.  Prominent  perivascular space anterior right lenticular nucleus.  No intracranial mass or abnormal enhancement.  Calcification of the cavernous segment of the internal carotid artery bilaterally with mild narrowing bilaterally.  Mild narrowing proximal right middle cerebral artery branch vessel. Mid to distal middle cerebral artery branch vessel irregularity bilaterally.  No significant stenosis of the vertebral arteries or basilar artery.  Mild posterior cerebral artery branch vessel irregularity.  No aneurysm or vascular malformation.   Review of the MIP images confirms the above findings.  IMPRESSION: Mild intracranial atherosclerotic type changes involving cavernous segment of the internal carotid arteries bilaterally, middle cerebral artery branch vessels and less so posterior cerebral artery branch vessels.  Small acute infarct posterior limb left internal capsule.  No associated hemorrhage.  Original Report Authenticated By: Fuller Canada, M.D.   Mr Brain Wo Contrast  07/01/2011  *RADIOLOGY REPORT*  Clinical Data:  Acute onset of slurred speech.  Right-sided facial numbness.  MRI HEAD WITHOUT CONTRAST MRA HEAD WITHOUT CONTRAST  Technique:  Multiplanar, multiecho pulse sequences of the brain and surrounding structures were obtained without intravenous contrast. Angiographic images of the head were obtained using MRA technique without contrast.  Comparison:  CT head without contrast 06/30/2011.  MRI HEAD  Findings:  A focal area of restricted diffusion in the posterior left putamen and extending superiorly within the corona radiata measures 8 mm in maximal axial dimension.  The T2 and FLAIR hyperintensities associated.  Multiple other T2 and FLAIR hyperintensities are present in the periventricular and subcortical white matter bilaterally.  Flow is present in the major intracranial arteries.  The ventricles are proportionate to the degree of atrophy.  No significant extra-axial fluid collection is present.  The  globes and orbits are intact.  The paranasal sinuses and mastoid air cells are clear.  IMPRESSION:  1.  Focal area of non hemorrhagic infarction involving the posterior left putamen and corona radiata. 2.  Other remote lacunar infarcts are present in the basal ganglia, particular the anterior putamen on the right. 3.  Atrophy and extensive white matter disease.  This likely reflects the sequelae of chronic microvascular ischemia.  MRA HEAD  Findings: The internal carotid arteries are within normal limits from the high cervical segments through the ICA termini.  The A1 and M1 segments are normal.  No definite anterior communicating artery is identified.  There is some tapering of distal MCA branch vessels.  The proximal ACA and MCA branch vessels are within normal limits.  The left vertebral artery is slightly dominant to the right.  The PICA origins are below the field of view bilaterally.  The basilar artery is within normal limits.  Both posterior cerebral arteries originate from the basilar tip.  There is some attenuation of distal PCA branch vessels.  IMPRESSION:  1.  Mild small vessel disease, less than expected given  the brain MRI findings. 2.  No significant proximal stenosis, aneurysm, or branch vessel occlusion.  Original Report Authenticated By: Jamesetta Orleans. MATTERN, M.D.   Mr Maxine Glenn Head/brain Wo Cm  07/01/2011  *RADIOLOGY REPORT*  Clinical Data:  Acute onset of slurred speech.  Right-sided facial numbness.  MRI HEAD WITHOUT CONTRAST MRA HEAD WITHOUT CONTRAST  Technique:  Multiplanar, multiecho pulse sequences of the brain and surrounding structures were obtained without intravenous contrast. Angiographic images of the head were obtained using MRA technique without contrast.  Comparison:  CT head without contrast 06/30/2011.  MRI HEAD  Findings:  A focal area of restricted diffusion in the posterior left putamen and extending superiorly within the corona radiata measures 8 mm in maximal axial dimension.   The T2 and FLAIR hyperintensities associated.  Multiple other T2 and FLAIR hyperintensities are present in the periventricular and subcortical white matter bilaterally.  Flow is present in the major intracranial arteries.  The ventricles are proportionate to the degree of atrophy.  No significant extra-axial fluid collection is present.  The globes and orbits are intact.  The paranasal sinuses and mastoid air cells are clear.  IMPRESSION:  1.  Focal area of non hemorrhagic infarction involving the posterior left putamen and corona radiata. 2.  Other remote lacunar infarcts are present in the basal ganglia, particular the anterior putamen on the right. 3.  Atrophy and extensive white matter disease.  This likely reflects the sequelae of chronic microvascular ischemia.  MRA HEAD  Findings: The internal carotid arteries are within normal limits from the high cervical segments through the ICA termini.  The A1 and M1 segments are normal.  No definite anterior communicating artery is identified.  There is some tapering of distal MCA branch vessels.  The proximal ACA and MCA branch vessels are within normal limits.  The left vertebral artery is slightly dominant to the right.  The PICA origins are below the field of view bilaterally.  The basilar artery is within normal limits.  Both posterior cerebral arteries originate from the basilar tip.  There is some attenuation of distal PCA branch vessels.  IMPRESSION:  1.  Mild small vessel disease, less than expected given the brain MRI findings. 2.  No significant proximal stenosis, aneurysm, or branch vessel occlusion.  Original Report Authenticated By: Jamesetta Orleans. MATTERN, M.D.     Medications: Scheduled:    . aspirin EC  325 mg Oral Daily  . enoxaparin  40 mg Subcutaneous Q24H  . mulitivitamin with minerals  1 tablet Oral Daily  . ramipril  10 mg Oral Daily  . DISCONTD: aspirin  300 mg Rectal Daily   Continuous:    . sodium chloride 20 mL (06/30/11 2200)       Assessment/Plan: Principal Problem:  *Acute cerebrovascular accident Active Problems:  Dysarthria due to cerebrovascular accident  Hypertension  Irregular heartbeat  History of syncope  Nicotine abuse  1. MRI c/w Focal area of non hemorrhagic infarction involving the posterior left putamen and corona radiata = Acute cerebrovascular accident- acute onset dysarthria, right facial droop, and right facial numbness consistent with ischemic CVA - good news is that he is 90+% resolved. Neurology did not feel that he was candidate for TPA given his low NIH stroke scale score.  Recent carotid studies and echocardiogram will not be repeated. Continue to monitor on telemetry as he may have some underlying dysrhythmia given his recent syncopal spell and frequent ectopy on monitor. Continue aspirin and await Neuro comment regarding transition him  to Plavix or Aggrenox. Encouraged smoking cessation. TC 165 TG 62 HDL 77 LDL 76 - This is perfect cholesterol and this CVA seems more HTN in nature. Old [place] 06/17/11 B Carotid stenosis ie 1-50%. Not enough to cause syncope.  2D ECHO- Left ventricle: The cavity size was normal. Wall thickness was increased in a pattern of mild LVH. Systolic function was normal. The estimated ejection fraction was in the range of 55% to 60%. Wall motion was normal; there were no regional wall motion abnormalities. Left ventricular diastolic function parameters were normal. - Mitral valve: Mild regurgitation. - Pulmonary arteries: PA peak pressure: 33mm Hg (S).  Therpy was done yesterday and he did well. Will bring back to my office soon and decide if outpt Rx necessary. CTA - Small vessel changes and minimal Blockages noted.  Discussed with pt. Neruo recommended ASA 325.  Will D/c today after lunch after cards eval for Bradycardia.  2. HTN - B/P  D/C Celebrex. Add amlodipine 2.5 3. Elevated CK - CK fine last check.  Trop I (-) 4. History of syncope and continued HR  in low 40s Sinus [place] Foley- Dr Tillie Rung to eval 5. Nicotine abuse via pipe smoking- strongly encourage smoking cessation. He does not feel that he needs a nicotine patch at this point. He is interested in Chantix upon discharge 6. Currently a little anemic - follow. 7. Anticipate D/C today  Ultram for OA no celebrex.  DVT Prophylaxis   Home med list:  1) Altace 10 Mg Caps (Ramipril) .... Take one capsule daily  2) Aspirin 81 Mg Ec Tab (Aspirin) .... Take one (1) tablet by mouth daily  3) C-500 Tabs (Ascorbic acid tabs) .Marland Kitchen.. 1 tablet by mouth once a day  4) Celebrex 200 Mg Caps (Celecoxib) .... Take one capsule daily  5) Lyrica 75 Mg Caps (Pregabalin) .... Take one tablet qhs prn (rare use)  6) Multivitamins Tabs (Multiple vitamin) .... Take one tablet by mouth every day  7) B Complex Vitamins Caps (B complex vitamins) .... Take one tablet by mouth 4 x week      LOS: 2 days   Kendall Justo M 07/02/2011, 8:06 AM

## 2011-07-06 ENCOUNTER — Encounter (INDEPENDENT_AMBULATORY_CARE_PROVIDER_SITE_OTHER): Payer: Medicare Other

## 2011-07-06 DIAGNOSIS — R55 Syncope and collapse: Secondary | ICD-10-CM | POA: Diagnosis not present

## 2011-07-20 ENCOUNTER — Encounter: Payer: Self-pay | Admitting: Internal Medicine

## 2011-07-20 DIAGNOSIS — I10 Essential (primary) hypertension: Secondary | ICD-10-CM | POA: Diagnosis not present

## 2011-07-21 ENCOUNTER — Encounter: Payer: Self-pay | Admitting: Cardiology

## 2011-07-22 DIAGNOSIS — IMO0001 Reserved for inherently not codable concepts without codable children: Secondary | ICD-10-CM | POA: Diagnosis not present

## 2011-07-31 DIAGNOSIS — R55 Syncope and collapse: Secondary | ICD-10-CM | POA: Diagnosis not present

## 2011-07-31 DIAGNOSIS — I498 Other specified cardiac arrhythmias: Secondary | ICD-10-CM | POA: Diagnosis not present

## 2011-07-31 DIAGNOSIS — C679 Malignant neoplasm of bladder, unspecified: Secondary | ICD-10-CM | POA: Diagnosis not present

## 2011-07-31 DIAGNOSIS — I6350 Cerebral infarction due to unspecified occlusion or stenosis of unspecified cerebral artery: Secondary | ICD-10-CM | POA: Diagnosis not present

## 2011-08-02 DIAGNOSIS — C679 Malignant neoplasm of bladder, unspecified: Secondary | ICD-10-CM | POA: Diagnosis not present

## 2011-08-02 DIAGNOSIS — Z8679 Personal history of other diseases of the circulatory system: Secondary | ICD-10-CM

## 2011-08-02 DIAGNOSIS — I498 Other specified cardiac arrhythmias: Secondary | ICD-10-CM | POA: Diagnosis not present

## 2011-08-02 DIAGNOSIS — I6350 Cerebral infarction due to unspecified occlusion or stenosis of unspecified cerebral artery: Secondary | ICD-10-CM | POA: Diagnosis not present

## 2011-08-02 DIAGNOSIS — R55 Syncope and collapse: Secondary | ICD-10-CM | POA: Diagnosis not present

## 2011-08-02 HISTORY — DX: Personal history of other diseases of the circulatory system: Z86.79

## 2011-08-09 ENCOUNTER — Ambulatory Visit (INDEPENDENT_AMBULATORY_CARE_PROVIDER_SITE_OTHER): Payer: Medicare Other | Admitting: Internal Medicine

## 2011-08-09 ENCOUNTER — Encounter: Payer: Self-pay | Admitting: Internal Medicine

## 2011-08-09 VITALS — BP 102/64 | HR 55 | Ht 72.0 in | Wt 145.1 lb

## 2011-08-09 DIAGNOSIS — Z87898 Personal history of other specified conditions: Secondary | ICD-10-CM

## 2011-08-09 DIAGNOSIS — R001 Bradycardia, unspecified: Secondary | ICD-10-CM

## 2011-08-09 DIAGNOSIS — I498 Other specified cardiac arrhythmias: Secondary | ICD-10-CM

## 2011-08-09 DIAGNOSIS — I10 Essential (primary) hypertension: Secondary | ICD-10-CM | POA: Diagnosis not present

## 2011-08-09 DIAGNOSIS — Z720 Tobacco use: Secondary | ICD-10-CM

## 2011-08-09 DIAGNOSIS — I6350 Cerebral infarction due to unspecified occlusion or stenosis of unspecified cerebral artery: Secondary | ICD-10-CM | POA: Diagnosis not present

## 2011-08-09 DIAGNOSIS — F17200 Nicotine dependence, unspecified, uncomplicated: Secondary | ICD-10-CM

## 2011-08-09 DIAGNOSIS — I639 Cerebral infarction, unspecified: Secondary | ICD-10-CM

## 2011-08-09 NOTE — Assessment & Plan Note (Signed)
He is using chantix and trying to quit

## 2011-08-09 NOTE — Patient Instructions (Addendum)
Your physician wants you to follow-up in: 6 months with Dr Jacquiline Doe will receive a reminder letter in the mail two months in advance. If you don't receive a letter, please call our office to schedule the follow-up appointment.  Your physician has recommended you make the following change in your medication:  1) STOP Amlodinpine 1)

## 2011-08-09 NOTE — Assessment & Plan Note (Signed)
The patient had syncope in the OR 3/13 (see my initial inpatient consult from 4/13).  He has done well without further syncope.  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  He has mild symptomatic orthostasis at times.  Today his BP is low.  I have reviewed his event monitor recently placed in detail.  This reveals sinus rhythm and sinus bradycardia.  There are rare pacs and pvcs.  There are no prolonged pauses or AV block.  He did have a 17 beat run of NSVT.  This occurred while sleeping 08/02/11 at 12:14 am.  He states that he was called by [employer] at the time.  He had been having a very vivid dream on chantix at the time.  He describes having a "nightmare" at the time that the NSVT was observed.    In the setting of normal LV function, I am not certain that this NSVT is the cause for his prior syncope.  I am more suspicious that his syncope may have been due to postural hypotension.  I will therefore stop amlodipine today. No other changes are planned.  He will contact me immediately if he has further syncope.

## 2011-08-09 NOTE — Assessment & Plan Note (Signed)
BP is low today. Given concerns for postural hypotension, I have stopped amlodipine today  Adequate hydration is encouraged.

## 2011-08-09 NOTE — Assessment & Plan Note (Signed)
No afib documented on his event monitor Management per neurology

## 2011-08-09 NOTE — Progress Notes (Signed)
    PCP:  Gwen Pounds, MD, MD  The patient presents today for routine electrophysiology followup.  Since his recent hospitalization for stroke, the patient reports doing very well.  He has made full recovery.  He has been busy doing yard work without symptoms. Today, he denies symptoms of palpitations, chest pain, shortness of breath, orthopnea, PND, lower extremity edema,  presyncope, further syncope, or new neurologic sequela.  He has occasional postural dizziness. The patient feels that he is tolerating medications without difficulties and is otherwise without complaint today.   Past Medical History  Diagnosis Date  . Hypertension   . Coronary artery disease   . Arthritis   . Bradycardia     chronic, asymptomatic  . Premature atrial contractions   . Syncope 3/13  . CVA (cerebral infarction) 4/13  . NSVT (nonsustained ventricular tachycardia) 08/02/11    17 beat NSVT documented on event monitor (asymptomatic)    Current Outpatient Prescriptions  Medication Sig Dispense Refill  . aspirin EC 325 MG tablet Take 325 mg by mouth daily.      . Multiple Vitamin (MULITIVITAMIN WITH MINERALS) TABS Take 1 tablet by mouth daily.      . pregabalin (LYRICA) 75 MG capsule Take 1 capsule (75 mg total) by mouth 2 (two) times daily as needed.      . ramipril (ALTACE) 10 MG capsule Take 10 mg by mouth daily.      . Varenicline Tartrate (CHANTIX PO) Take 5 mg by mouth daily.      . vitamin C (ASCORBIC ACID) 500 MG tablet Take 500 mg by mouth daily.        No Known Allergies  History   Social History  . Marital Status: Married    Spouse Name: N/A    Number of Children: N/A  . Years of Education: N/A   Occupational History  . Not on file.   Social History Main Topics  . Smoking status: Current Everyday Smoker -- 25 years    Types: Pipe  . Smokeless tobacco: Not on file   Comment: using chantix and trying to quit  . Alcohol Use: 4.2 oz/week    2 Glasses of wine, 5 Cans of beer per week    . Drug Use: Not on file  . Sexually Active: Yes   Other Topics Concern  . 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      Physical Exam: Filed Vitals:   08/09/11 1238  BP: 102/64  Pulse: 55  Height: 6' (1.829 m)  Weight: 145 lb 1.9 oz (65.826 kg)    GEN- The patient is well appearing, alert and oriented x 3 today.   Head- normocephalic, atraumatic Eyes-  Sclera clear, conjunctiva pink Ears- hearing intact Oropharynx- clear Neck- supple, no JVP Lymph- no cervical lymphadenopathy Lungs- Clear to ausculation bilaterally, normal work of breathing Heart- Regular rate and rhythm, no murmurs, rubs or gallops, PMI not laterally displaced GI- soft, NT, ND, + BS Extremities- no clubbing, cyanosis, or edema MS- no significant deformity or atrophy Skin- no rash or lesion Psych- euthymic mood, full affect Neuro- strength and sensation are intact  Event monitor 07/06/11-08/05/11 reviewed ekg today reveals sinus bradycardia 55 bpm, PR 146, LVH  Assessment and Plan:

## 2011-08-11 DIAGNOSIS — I6350 Cerebral infarction due to unspecified occlusion or stenosis of unspecified cerebral artery: Secondary | ICD-10-CM | POA: Diagnosis not present

## 2011-08-12 DIAGNOSIS — H2510 Age-related nuclear cataract, unspecified eye: Secondary | ICD-10-CM | POA: Diagnosis not present

## 2011-08-17 DIAGNOSIS — Z8551 Personal history of malignant neoplasm of bladder: Secondary | ICD-10-CM | POA: Diagnosis not present

## 2011-08-17 DIAGNOSIS — R972 Elevated prostate specific antigen [PSA]: Secondary | ICD-10-CM | POA: Diagnosis not present

## 2011-08-23 DIAGNOSIS — N401 Enlarged prostate with lower urinary tract symptoms: Secondary | ICD-10-CM | POA: Diagnosis not present

## 2011-08-23 DIAGNOSIS — Z8551 Personal history of malignant neoplasm of bladder: Secondary | ICD-10-CM | POA: Diagnosis not present

## 2011-08-23 DIAGNOSIS — R972 Elevated prostate specific antigen [PSA]: Secondary | ICD-10-CM | POA: Diagnosis not present

## 2011-09-29 DIAGNOSIS — R972 Elevated prostate specific antigen [PSA]: Secondary | ICD-10-CM | POA: Diagnosis not present

## 2011-10-26 ENCOUNTER — Encounter (HOSPITAL_COMMUNITY): Payer: Self-pay

## 2011-10-26 ENCOUNTER — Emergency Department (HOSPITAL_COMMUNITY): Payer: Medicare Other

## 2011-10-26 ENCOUNTER — Inpatient Hospital Stay (HOSPITAL_COMMUNITY)
Admission: EM | Admit: 2011-10-26 | Discharge: 2011-10-28 | DRG: 065 | Disposition: A | Discharge status: Home / Self Care | Payer: Medicare Other | Attending: Internal Medicine | Admitting: Internal Medicine

## 2011-10-26 DIAGNOSIS — I4729 Other ventricular tachycardia: Secondary | ICD-10-CM | POA: Diagnosis present

## 2011-10-26 DIAGNOSIS — R209 Unspecified disturbances of skin sensation: Secondary | ICD-10-CM | POA: Diagnosis not present

## 2011-10-26 DIAGNOSIS — R001 Bradycardia, unspecified: Secondary | ICD-10-CM | POA: Diagnosis present

## 2011-10-26 DIAGNOSIS — Z79899 Other long term (current) drug therapy: Secondary | ICD-10-CM

## 2011-10-26 DIAGNOSIS — I472 Ventricular tachycardia, unspecified: Secondary | ICD-10-CM | POA: Diagnosis present

## 2011-10-26 DIAGNOSIS — G4730 Sleep apnea, unspecified: Secondary | ICD-10-CM | POA: Diagnosis present

## 2011-10-26 DIAGNOSIS — I639 Cerebral infarction, unspecified: Secondary | ICD-10-CM | POA: Diagnosis present

## 2011-10-26 DIAGNOSIS — IMO0002 Reserved for concepts with insufficient information to code with codable children: Secondary | ICD-10-CM | POA: Diagnosis present

## 2011-10-26 DIAGNOSIS — Z720 Tobacco use: Secondary | ICD-10-CM | POA: Diagnosis present

## 2011-10-26 DIAGNOSIS — R471 Dysarthria and anarthria: Secondary | ICD-10-CM | POA: Diagnosis present

## 2011-10-26 DIAGNOSIS — I6350 Cerebral infarction due to unspecified occlusion or stenosis of unspecified cerebral artery: Secondary | ICD-10-CM | POA: Diagnosis not present

## 2011-10-26 DIAGNOSIS — Z8551 Personal history of malignant neoplasm of bladder: Secondary | ICD-10-CM

## 2011-10-26 DIAGNOSIS — Z7982 Long term (current) use of aspirin: Secondary | ICD-10-CM

## 2011-10-26 DIAGNOSIS — I2510 Atherosclerotic heart disease of native coronary artery without angina pectoris: Secondary | ICD-10-CM | POA: Diagnosis present

## 2011-10-26 DIAGNOSIS — I498 Other specified cardiac arrhythmias: Secondary | ICD-10-CM | POA: Diagnosis present

## 2011-10-26 DIAGNOSIS — M129 Arthropathy, unspecified: Secondary | ICD-10-CM | POA: Diagnosis present

## 2011-10-26 DIAGNOSIS — D649 Anemia, unspecified: Secondary | ICD-10-CM | POA: Diagnosis present

## 2011-10-26 DIAGNOSIS — M1990 Unspecified osteoarthritis, unspecified site: Secondary | ICD-10-CM | POA: Diagnosis present

## 2011-10-26 DIAGNOSIS — I6789 Other cerebrovascular disease: Secondary | ICD-10-CM | POA: Diagnosis not present

## 2011-10-26 DIAGNOSIS — Z8546 Personal history of malignant neoplasm of prostate: Secondary | ICD-10-CM

## 2011-10-26 DIAGNOSIS — I69922 Dysarthria following unspecified cerebrovascular disease: Secondary | ICD-10-CM | POA: Diagnosis not present

## 2011-10-26 DIAGNOSIS — Z8673 Personal history of transient ischemic attack (TIA), and cerebral infarction without residual deficits: Secondary | ICD-10-CM

## 2011-10-26 DIAGNOSIS — I10 Essential (primary) hypertension: Secondary | ICD-10-CM | POA: Diagnosis present

## 2011-10-26 DIAGNOSIS — M5412 Radiculopathy, cervical region: Secondary | ICD-10-CM | POA: Diagnosis present

## 2011-10-26 DIAGNOSIS — F17200 Nicotine dependence, unspecified, uncomplicated: Secondary | ICD-10-CM | POA: Diagnosis present

## 2011-10-26 DIAGNOSIS — G319 Degenerative disease of nervous system, unspecified: Secondary | ICD-10-CM | POA: Diagnosis not present

## 2011-10-26 LAB — CBC WITH DIFFERENTIAL/PLATELET
Basophils Relative: 1 % (ref 0–1)
Eosinophils Absolute: 0.1 10*3/uL (ref 0.0–0.7)
Hemoglobin: 10.4 g/dL — ABNORMAL LOW (ref 13.0–17.0)
MCH: 30.5 pg (ref 26.0–34.0)
MCHC: 33.8 g/dL (ref 30.0–36.0)
Monocytes Relative: 12 % (ref 3–12)
Neutrophils Relative %: 47 % (ref 43–77)
Platelets: 154 10*3/uL (ref 150–400)

## 2011-10-26 LAB — BASIC METABOLIC PANEL
BUN: 12 mg/dL (ref 6–23)
GFR calc Af Amer: >90 mL/min (ref >90)
GFR calc non Af Amer: 84 mL/min — ABNORMAL LOW (ref >90)
Potassium: 3.8 mEq/L (ref 3.5–5.1)

## 2011-10-26 LAB — GLUCOSE, CAPILLARY: Glucose-Capillary: 116 mg/dL — ABNORMAL HIGH (ref 70–99)

## 2011-10-26 MED ORDER — ASPIRIN EC 325 MG PO TBEC
325.0000 mg | DELAYED_RELEASE_TABLET | Freq: Every day | ORAL | Status: DC
  Filled 2011-10-26 (×2): qty 1

## 2011-10-26 MED ORDER — VITAMIN C 500 MG PO TABS
500.0000 mg | ORAL_TABLET | Freq: Every day | ORAL | Status: DC
  Administered 2011-10-27 – 2011-10-28 (×2): 500 mg via ORAL
  Filled 2011-10-26 (×3): qty 1

## 2011-10-26 MED ORDER — ADULT MULTIVITAMIN W/MINERALS CH
1.0000 | ORAL_TABLET | Freq: Every day | ORAL | Status: DC
  Administered 2011-10-27 – 2011-10-28 (×2): 1 via ORAL
  Filled 2011-10-26 (×3): qty 1

## 2011-10-26 MED ORDER — RAMIPRIL 10 MG PO CAPS
10.0000 mg | ORAL_CAPSULE | Freq: Every day | ORAL | Status: DC
  Administered 2011-10-27 – 2011-10-28 (×2): 10 mg via ORAL
  Filled 2011-10-26 (×3): qty 1

## 2011-10-26 MED ORDER — ACETAMINOPHEN 325 MG PO TABS: 650.0000 mg | ORAL_TABLET | ORAL | Status: DC | PRN

## 2011-10-26 MED ORDER — SODIUM CHLORIDE 0.9 % IJ SOLN: 3.0000 mL | INTRAMUSCULAR | Status: DC | PRN

## 2011-10-26 NOTE — ED Notes (Signed)
MD at bedside. 

## 2011-10-26 NOTE — H&P (Signed)
PCP:   Gwen Pounds, MD   Chief Complaint:  L sided weakness  HPI: Patient is a 71 year old male who had a history of 2 prior strokes, the last being in April of this year, followed by [name] as outpatient 2 months ago.  Yesterday he started to note some L facial tingling and had progression into his L forearm today.  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  Later in the afternoon, had his BP check ed as symptoms were worsening and the systolics were in the 150-160 range.  Contacted our office and was told to go to the ER where the MRI showed a new CVA.  He notes that he still smokes a pipe from time to time, is not monitoring his BP at home routinely.  States that he had been taken off Norvasc prior due to possible low BP's.  Review of Systems:  General:       Denies fevers, chills, headache, sweats.   Cardiovascular:       Denies chest pains, claudication, palpitations, syncope, dyspnea on exertion, orthopnea, PND, peripheral edema.   Respiratory:       Denies cough, dyspnea, excessive sputum, hemoptysis, wheezing.   Gastrointestinal:       Denies nausea, vomiting, diarrhea, constipation, heartburn, change in bowel habits, abdominal pain, melena, hematochezia, jaundice.   Musculoskeletal:       Complains of arthritis.        Denies back pain, joint pain, joint swelling, muscle cramps, muscle weakness.       Neurologic:       L facial numbness better, still present in L forearm into fingers Past Medical History: Past Medical History  Diagnosis Date  . Hypertension   . Coronary artery disease   . Arthritis   . Bradycardia     chronic, asymptomatic  . Premature atrial contractions   . Syncope 3/13  . CVA (cerebral infarction) 4/13  . NSVT (nonsustained ventricular tachycardia) 08/02/11    17 beat NSVT documented on event monitor (asymptomatic)   History reviewed. No pertinent past surgical history.  Medications: Prior to Admission medications   Medication Sig Start  Date End Date Taking? Authorizing Provider  aspirin EC 325 MG tablet Take 325 mg by mouth daily.   Yes Historical Provider, MD  Multiple Vitamin (MULITIVITAMIN WITH MINERALS) TABS Take 1 tablet by mouth daily.   Yes Historical Provider, MD  pregabalin (LYRICA) 75 MG capsule Take 75 mg by mouth 2 (two) times daily as needed. For nerve pain   Yes Historical Provider, MD  ramipril (ALTACE) 10 MG capsule Take 10 mg by mouth daily.   Yes Historical Provider, MD  vitamin C (ASCORBIC ACID) 500 MG tablet Take 500 mg by mouth daily.   Yes Historical Provider, MD    Allergies:  No Known Allergies  Social History:  reports that he has been smoking Pipe.  He does not have any smokeless tobacco history on file. He reports that he drinks about 4.2 ounces of alcohol per week. His drug history not on file.  Family History: No family history on file.  Physical Exam: Filed Vitals:   10/26/11 1744 10/26/11 1905 10/26/11 1907 10/26/11 1930  BP: 179/69  176/79 162/82  Pulse: 55  53 52  Temp: 98.1 F (36.7 C) 98.1 F (36.7 C)    TempSrc: Oral     Resp: 20  18 19   SpO2: 98%  96% 98%   General appearance: alert, cooperative and  appears stated age Head: Normocephalic, without obvious abnormality, atraumatic Eyes: conjunctivae/corneas clear. PERRL, EOM's intact.  Nose: Nares normal. Septum midline. Mucosa normal. No drainage or sinus tenderness. Throat: lips, mucosa, and tongue normal; teeth and gums normal Neck: no adenopathy, no carotid bruit, no JVD and thyroid not enlarged, symmetric, no tenderness/mass/nodules Resp: clear to auscultation bilaterally Cardio: regular rate and rhythm, S1, S2 normal, no murmur, click, rub or gallop GI: soft, non-tender; bowel sounds normal; no masses,  no organomegaly Extremities: extremities normal, atraumatic, no cyanosis or edema Pulses: 2+ and symmetric Lymph nodes: Cervical adenopathy: no cervical lymphadenopathy Neurologic: Alert and oriented X 3, normal  strength and tone. Slight sensory loss L forearm and hand, L face, improved from earlier in the day.  Labs on Admission:   [facility] 10/26/11 1759  NA 136  K 3.8  CL 101  CO2 26  GLUCOSE 118*  BUN 12  CREATININE 0.87  CALCIUM 8.6  MG --  PHOS --    Lab Results  Component Value Date   INR 0.98 06/30/2011  Radiological Exams on Admission: Ct Head Wo Contrast  10/26/2011  *RADIOLOGY REPORT*  Clinical Data: Left forearm and hand parasthesias, prior strokes  CT HEAD WITHOUT CONTRAST  Technique:  Contiguous axial images were obtained from the base of the skull through the vertex without contrast.  Comparison: 07/01/2011  Findings:  stable brain atrophy and patchy white matter microvascular ischemic changes.  No acute intracranial hemorrhage, definite acute infarction, mass lesion, midline shift, herniation, hydrocephalus, or extra-axial fluid collection.  Gray-white matter differentiation maintained.  Small punctate basal ganglia lacunar type infarcts.  Cisterns patent.  No cerebellar abnormality. Symmetric orbits.  Mastoids and sinuses clear.  IMPRESSION: Stable atrophy and microvascular ischemic changes.  No interval change or acute process.  Original Report Authenticated By: Judie Petit. Ruel Favors, M.D.   Mr Brain Wo Contrast  10/26/2011  *RADIOLOGY REPORT*  Clinical Data: Left sided numbness.  MRI HEAD WITHOUT CONTRAST  Technique:  Multiplanar, multiecho pulse sequences of the brain and surrounding structures were obtained according to standard protocol without intravenous contrast.  Comparison: 10/26/2011 CT.  06/30/2011 MR.  Findings: Small acute non hemorrhagic right thalamic infarct.  Remote small basal ganglia infarcts.  Prominent small vessel disease type changes.  No intracranial hemorrhage.  No intracranial mass lesion detected on this unenhanced exam.  Global atrophy without hydrocephalus.  Major intracranial vascular structures are patent.  Cervical spondylotic changes with minimal anterior  slip of C3. Mild spinal stenosis C3-4 and C4-5.  Transverse ligament hypertrophy.  Minimal mucosal thickening inferior aspect right maxillary sinus. Artifact by dental work limits evaluation.  IMPRESSION:  Small acute non hemorrhagic right thalamic infarct.  Please see above.  Critical Value/emergent results were called by telephone at the time of interpretation on 10/26/2011 at 8:20 p.m. to Dr. Rhunette Croft, who verbally acknowledged these results.  Original Report Authenticated By: Fuller Canada, M.D.   Assessment/Plan 1) R thalamic CVA.  He has been through prior evals including Carotids and Echo from last April.  It is clear that his BP is not well controlled and there is no home monitoring in place.  Will admit.  [facility] have been notified by the ER MD to see the patient tonight and make further recommendations and orders regarding stroke care, which I will leave in their capable hands.  Protocol Neuro evals, PT/OT and fasting lipids have been ordered.  Will set ideal goals as BP <130/80, LDL cholesterol <70.  They may wish to  move from ASA to Plavix, which the patient is not too happy of the idea. 2) HTN:  WIll not lower BP quickly.  Needs a home monitor and a PRN med.  Continue Ramipril for now. 3) Tobacco:  Must give up pipes.  Chantix had been prescribed in the past. 4) Will put in observation status for now.  Valene Villa W 10/26/2011, 9:21 PM

## 2011-10-26 NOTE — ED Provider Notes (Signed)
History     CSN: [id number]  Arrival date & time 10/26/11  1720   First MD Initiated Contact with Patient 10/26/11 1731      Chief Complaint  Patient presents with  . Numbness    (Consider location/radiation/quality/duration/timing/severity/associated sxs/prior treatment) Patient is a 71 y.o. male presenting with neurologic complaint. The history is provided by the patient and the spouse.  Neurologic Problem The primary symptoms include paresthesias and loss of sensation. Primary symptoms do not include headaches, syncope, loss of consciousness, altered mental status, seizures, dizziness, visual change, focal weakness, speech change, fever, nausea or vomiting. The symptoms began yesterday. The symptoms are worsening. The neurological symptoms are focal.  Paresthesias began 12 - 24 hours ago. The paresthesias are worsening. The paresthesias are described as tingling. Affected locations include the: left side of the face, left forearm and left hand.  Loss of sensation began 12 - 24 hours ago. The loss of sensation is worsening. Location of loss of sensation: left face, left hand.  Additional symptoms do not include weakness.  Neurologic Problem The patient's pertinent negatives include no altered mental status, focal weakness, visual change or weakness. The current episode started yesterday. The problem has been worsening since onset. Pertinent negatives include no abdominal pain, chest pain, confusion, dizziness, fever, headaches, nausea, shortness of breath or vomiting.    Past Medical History  Diagnosis Date  . Hypertension   . Coronary artery disease   . Arthritis   . Bradycardia     chronic, asymptomatic  . Premature atrial contractions   . Syncope 3/13  . CVA (cerebral infarction) 4/13  . NSVT (nonsustained ventricular tachycardia) 08/02/11    17 beat NSVT documented on event monitor (asymptomatic)    History reviewed. No pertinent past surgical history.  No family history  on file.  History  Substance Use Topics  . Smoking status: Current Everyday Smoker -- 25 years    Types: Pipe  . Smokeless tobacco: Not on file   Comment: using chantix and trying to quit  . Alcohol Use: 4.2 oz/week    2 Glasses of wine, 5 Cans of beer per week      Review of Systems  Constitutional: Negative for fever and chills.  HENT: Negative for congestion and rhinorrhea.   Respiratory: Negative for cough and shortness of breath.   Cardiovascular: Negative for chest pain, leg swelling and syncope.  Gastrointestinal: Negative for nausea, vomiting, abdominal pain, constipation and blood in stool.  Genitourinary: Negative for dysuria and decreased urine volume.  Neurological: Positive for numbness and paresthesias. Negative for dizziness, speech change, focal weakness, seizures, loss of consciousness, speech difficulty, weakness and headaches.  Psychiatric/Behavioral: Negative for confusion and altered mental status.  All other systems reviewed and are negative.    Allergies  Review of patient's allergies indicates no known allergies.  Home Medications   Current Outpatient Rx  Name Route Sig Dispense Refill  . ASPIRIN EC 325 MG PO TBEC Oral Take 325 mg by mouth daily.    . ADULT MULTIVITAMIN W/MINERALS CH Oral Take 1 tablet by mouth daily.    Marland Kitchen PREGABALIN 75 MG PO CAPS Oral Take 1 capsule (75 mg total) by mouth 2 (two) times daily as needed.    Marland Kitchen RAMIPRIL 10 MG PO CAPS Oral Take 10 mg by mouth daily.    . CHANTIX PO Oral Take 5 mg by mouth daily.    Marland Kitchen VITAMIN C 500 MG PO TABS Oral Take 500 mg by mouth daily.  BP 179/69  Pulse 55  Temp 98.1 F (36.7 C) (Oral)  Resp 20  SpO2 98%  Physical Exam  Nursing note and vitals reviewed. Constitutional: He is oriented to person, place, and time. He appears well-developed and well-nourished.  HENT:  Head: Normocephalic and atraumatic.  Right Ear: External ear normal.  Left Ear: External ear normal.  Nose: Nose normal.   Eyes: EOM are normal. Pupils are equal, round, and reactive to light.  Neck: Neck supple.  Cardiovascular: Normal rate, regular rhythm, normal heart sounds and intact distal pulses.   Pulmonary/Chest: Effort normal.  Abdominal: Soft. He exhibits no distension and no mass. There is no tenderness. There is no rebound and no guarding.  Musculoskeletal: He exhibits no edema.  Lymphadenopathy:    He has no cervical adenopathy.  Neurological: He is alert and oriented to person, place, and time. He has normal strength. No cranial nerve deficit. He exhibits normal muscle tone. GCS eye subscore is 4. GCS verbal subscore is 5. GCS motor subscore is 6.       Decreased sensation to touch to left face and left distal hand  Skin: Skin is warm and dry.    ED Course  Procedures (including critical care time)  Labs Reviewed  CBC WITH DIFFERENTIAL - Abnormal; Notable for the following:    WBC 3.7 (*)     RBC 3.41 (*)     Hemoglobin 10.4 (*)     HCT 30.8 (*)     All other components within normal limits  BASIC METABOLIC PANEL - Abnormal; Notable for the following:    Glucose, Bld 118 (*)     GFR calc non Af Amer 84 (*)     All other components within normal limits  GLUCOSE, CAPILLARY - Abnormal; Notable for the following:    Glucose-Capillary 112 (*)     All other components within normal limits  GLUCOSE, CAPILLARY - Abnormal; Notable for the following:    Glucose-Capillary 116 (*)     All other components within normal limits  GLUCOSE, POCT (MANUAL RESULT ENTRY)  GLUCOSE, POCT (MANUAL RESULT ENTRY)  LIPID PANEL  URINE RAPID DRUG SCREEN (HOSP PERFORMED)  HEMOGLOBIN A1C   Ct Head Wo Contrast  10/26/2011  *RADIOLOGY REPORT*  Clinical Data: Left forearm and hand parasthesias, prior strokes  CT HEAD WITHOUT CONTRAST  Technique:  Contiguous axial images were obtained from the base of the skull through the vertex without contrast.  Comparison: 07/01/2011  Findings:  stable brain atrophy and patchy  white matter microvascular ischemic changes.  No acute intracranial hemorrhage, definite acute infarction, mass lesion, midline shift, herniation, hydrocephalus, or extra-axial fluid collection.  Gray-white matter differentiation maintained.  Small punctate basal ganglia lacunar type infarcts.  Cisterns patent.  No cerebellar abnormality. Symmetric orbits.  Mastoids and sinuses clear.  IMPRESSION: Stable atrophy and microvascular ischemic changes.  No interval change or acute process.  Original Report Authenticated By: Judie Petit. Ruel Favors, M.D.   Mr Brain Wo Contrast  10/26/2011  *RADIOLOGY REPORT*  Clinical Data: Left sided numbness.  MRI HEAD WITHOUT CONTRAST  Technique:  Multiplanar, multiecho pulse sequences of the brain and surrounding structures were obtained according to standard protocol without intravenous contrast.  Comparison: 10/26/2011 CT.  06/30/2011 MR.  Findings: Small acute non hemorrhagic right thalamic infarct.  Remote small basal ganglia infarcts.  Prominent small vessel disease type changes.  No intracranial hemorrhage.  No intracranial mass lesion detected on this unenhanced exam.  Global atrophy without hydrocephalus.  Major  intracranial vascular structures are patent.  Cervical spondylotic changes with minimal anterior slip of C3. Mild spinal stenosis C3-4 and C4-5.  Transverse ligament hypertrophy.  Minimal mucosal thickening inferior aspect right maxillary sinus. Artifact by dental work limits evaluation.  IMPRESSION:  Small acute non hemorrhagic right thalamic infarct.  Please see above.  Critical Value/emergent results were called by telephone at the time of interpretation on 10/26/2011 at 8:20 p.m. to Dr. Rhunette Croft, who verbally acknowledged these results.  Original Report Authenticated By: Fuller Canada, M.D.     1. CVA    MDM  Left facial numbness to midline of mouth since last night, now left hand numbness slowly going proximal. No weakness noted. CT head negative, labs  benign. Hypertensive to 170s, 180s. No meds given due to concern for ischemic stroke. Sent for MRI after neuro consulted, shows ischemic stroke. Will admit to his PCP.         Pricilla Loveless, MD 10/26/11 [id number]

## 2011-10-26 NOTE — Consult Note (Signed)
Consulting physician:  Reason for consult: Left hand paresthesia, fine motor skills abnormalities on the left hand, perioral numbness    Chief Complaint:  Left hand paresthesias, motor deficits,  Perioral numbness HPI:MR.  Philip Ward is an 71 y.o. male RHWM with multiple stroke risk factors including, B/L Carotid stenosis, as evidenced by recent carotid dopplers in a  Cardiology office He has been having perioral numbness, tongue weakness, left arm paresthesias that has been going on predominantly since last night, this morning it got worse and  He decided to come in. He was not a TPA candidate Because NIH scale 1 Outside the window   Patient seen at the bedside, he is a good historian and mentions that he had similar symptoms in 06/2011  On the right hand and was diagnosed with ischemic stroke in left hemisphere He denies chest pain, no sob, no abdominal pain, no nausea, no vomiting, no dizziness, no speech impairment. No headaches, no seizure like activity, no history of migraine disorder  Past Medical History  Diagnosis Date  . Hypertension   . Coronary artery disease   . Arthritis   . Bradycardia     chronic, asymptomatic  . Premature atrial contractions   . Syncope 3/13  . CVA (cerebral infarction) 4/13  . NSVT (nonsustained ventricular tachycardia) 08/02/11    17 beat NSVT documented on event monitor (asymptomatic)     Sleep Apnea    Cervical Radiculopathy     Lumbar Radiculopathy  No family history on file. Social History:  reports that he has been smoking Pipe.  He does not have any smokeless tobacco history on file. He reports that he drinks about 4.2 ounces of alcohol per week. His drug history not on file. 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 He is married and lives at home with his wife.   Allergies: No Known Allergies   ROS: All the 14 ROS were reviewed and pertinent are mentioned in the HPI   Physical Examination: Blood pressure 176/79,  pulse 53, temperature 98.1 F (36.7 C), temperature source Oral, resp. rate 18, SpO2 96.00%.  General Examination:    HEENT-  Normocephalic, no lesions, without obvious abnormality.  Normal external eye and conjunctiva.  Normal TM's bilaterally.  Normal auditory canals and external ears. Normal external nose, mucus membranes and septum.  Normal pharynx. Neck supple with no masses, nodes, nodules or enlargement., mild lateral anterior torticollis Cardiovascular Heart exam - S1, S2 normal, no murmur, no gallop, rate regular Lungs: CTA Abdomen : Soft, non tender, non distended, bowel sounds present Skin: No rash Extremities; No rash    Neurologic Examination: Mental Status: Alert, oriented, thought content appropriate.  Speech fluent without evidence of aphasia.  Able to follow 3 step commands without difficulty. Insight: intact Thought processes: intact Mood; pleasant Cognition: intact No hallucinations No delusions  Cranial Nerves: II: visual fields  Compromised in the OS  Secondary to traumatic corneal damage during childhood, pupils equal, round, reactive to light and accommodation III,IV, VI: ptosis not present, extra-ocular motions intact bilaterally V,VII: smile symmetric, facial light touch sensation mildly compromised on the left  VIII: hearing normal bilaterally IX,X: gag reflex present XI: trapezius strength/neck flexion strength normal bilaterally XII: tongue strength normal   Motor: Right : Upper extremity   5/5    Left:     Upper extremity   5/5  Lower extremity   5/5     Lower extremity   5/5  Patient has mild drift  on the Both the LE but that is from Lumbar Stenosis  Tone intact Significant atrophy  Of the hand muscles, including intrinsics, thenar, hypothenar, subluxation of the wrist and carpals.   Sensory: Pinprick and light touch  Patient agrees to decrease but patient physically representing hyperesthesia. Vibration: intact Deep Tendon Reflexes: 2+ and  symmetric throughout Plantars: Right: downgoing   Left: downgoing Cerebellar: normal finger-to-nose, normal rapid alternating movements and normal heel-to-shin test normal gait and station    Laboratory Studies: Basic Metabolic Panel:  Lab 10/26/11 1610  NA 136  K 3.8  CL 101  CO2 26  GLUCOSE 118*  BUN 12  CREATININE 0.87  CALCIUM 8.6  MG --  PHOS --    Liver Function Tests: No results found for this basename: AST:5,ALT:5,ALKPHOS:5,BILITOT:5,PROT:5,ALBUMIN:5 in the last 168 hours No results found for this basename: LIPASE:5,AMYLASE:5 in the last 168 hours No results found for this basename: AMMONIA:3 in the last 168 hours  CBC:  Lab 10/26/11 1759  WBC 3.7*  NEUTROABS 1.7  HGB 10.4*  HCT 30.8*  MCV 90.3  PLT 154    Cardiac Enzymes: No results found for this basename: CKTOTAL:5,CKMB:5,CKMBINDEX:5,TROPONINI:5 in the last 168 hours  BNP: No components found with this basename: POCBNP:5  CBG:  Lab 10/26/11 1805  GLUCAP 112*    Microbiology: No results found for this or any previous visit.  Coagulation Studies: No results found for this basename: LABPROT:5,INR:5 in the last 72 hours  Urinalysis: No results found for this basename: COLORURINE:2,APPERANCEUR:2,LABSPEC:2,PHURINE:2,GLUCOSEU:2,HGBUR:2,BILIRUBINUR:2,KETONESUR:2,PROTEINUR:2,UROBILINOGEN:2,NITRITE:2,LEUKOCYTESUR:2 in the last 168 hours  Lipid Panel:    Component Value Date/Time   CHOL 165 07/01/2011 0600   TRIG 62 07/01/2011 0600   HDL 77 07/01/2011 0600   CHOLHDL 2.1 07/01/2011 0600   VLDL 12 07/01/2011 0600   LDLCALC 76 07/01/2011 0600    HgbA1C: Lab Results  Component Value Date   HGBA1C 5.6 07/01/2011    Urine Drug Screen:     Component Value Date/Time   LABOPIA NONE DETECTED 06/30/2011 1536   COCAINSCRNUR NONE DETECTED 06/30/2011 1536   LABBENZ NONE DETECTED 06/30/2011 1536   AMPHETMU NONE DETECTED 06/30/2011 1536   THCU NONE DETECTED 06/30/2011 1536   LABBARB NONE DETECTED 06/30/2011  1536     Alcohol Level: No results found for this basename: ETH:2 in the last 168 hours  Other results: EKG:  "normal EKG, normal sinus rhythm","unchanged from previous tracings"}.  Imaging: Ct Head Wo Contrast  10/26/2011  *RADIOLOGY REPORT*  Clinical Data: Left forearm and hand parasthesias, prior strokes  CT HEAD WITHOUT CONTRAST  Technique:  Contiguous axial images were obtained from the base of the skull through the vertex without contrast.  Comparison: 07/01/2011  Findings:  stable brain atrophy and patchy white matter microvascular ischemic changes.  No acute intracranial hemorrhage, definite acute infarction, mass lesion, midline shift, herniation, hydrocephalus, or extra-axial fluid collection.  Gray-white matter differentiation maintained.  Small punctate basal ganglia lacunar type infarcts.  Cisterns patent.  No cerebellar abnormality. Symmetric orbits.  Mastoids and sinuses clear.  IMPRESSION: Stable atrophy and microvascular ischemic changes.  No interval change or acute process.  Original Report Authenticated By: Judie Petit. Ruel Favors, M.D.    Assessment/Plan  This is a 71 y/o [place] with stroke risk factors, now comes with paresthesias on the left side with perioral numbness, and some loss of taste sensations. Patient has no new deficits but in certain areas he is hyperesthesia, also weakness is attributed to cervical radiculopathy and lumbar spinal stenosis.  Differential Diagnosis  1) Thalamic  Infarct on the right 2) Right Putamen infarct 3) lacunar infarct in lenticular nucleus 4) Cervical radiculopathy   Recommendations: 1) Do the MRI of the brain if the MRI indicates ischemic stroke, we can do the stroke work up He had recent Carotid dopplers, he may benefit from MRA of the neck if stroke is indicated on MRI 2) ASA  3) Do not treat the BP unless it is > 220/120 for the first 72 hours. He is already out 24 hours from the onset of the symptoms 4) He may not need PT. OT    5) Stroke labs   Maddon Horton V-P Eilleen Kempf., MD., Ph.D.,MS 09/15/2011, 12:54 PM  10/26/2011, 7:47 PM

## 2011-10-26 NOTE — ED Notes (Signed)
Pt is from home, sts he has a hx of an old stroke in April. Presents today for onset of symptoms since 1900 last night of numbness in mouth, cheek, and gums on left side. Denies any speech difficulties or swallowing difficulties. Also states that he started having numbness into his left hand, more to the thumb, first and second digits. Normally has numbness in 4th and 5th digits. Denies any gait abnormality

## 2011-10-26 NOTE — ED Notes (Signed)
Pt taken to CT.

## 2011-10-26 NOTE — ED Notes (Signed)
Patient in MRI 

## 2011-10-26 NOTE — ED Notes (Signed)
Dr. Criss Alvine back at the bedside to update patient on further orders.

## 2011-10-26 NOTE — ED Notes (Signed)
States having sensory problems (numbness) on left. States started last night and got progressively worse. Similar symptoms last April on right side of face. Denies pain at this time

## 2011-10-26 NOTE — ED Notes (Signed)
Patient's wife and belongings transported to CDU #9. Patient still in MRI. Will call radiology and inform them to transport patient to CDU#9 once his scan is complete. Report given to Vickey Huger, RN in CDU.

## 2011-10-27 DIAGNOSIS — I69922 Dysarthria following unspecified cerebrovascular disease: Secondary | ICD-10-CM | POA: Diagnosis not present

## 2011-10-27 DIAGNOSIS — I6350 Cerebral infarction due to unspecified occlusion or stenosis of unspecified cerebral artery: Secondary | ICD-10-CM | POA: Diagnosis not present

## 2011-10-27 DIAGNOSIS — I498 Other specified cardiac arrhythmias: Secondary | ICD-10-CM | POA: Diagnosis not present

## 2011-10-27 LAB — LIPID PANEL
Cholesterol: 148 mg/dL (ref 0–200)
Triglycerides: 80 mg/dL (ref <150)

## 2011-10-27 LAB — GLUCOSE, CAPILLARY: Glucose-Capillary: 103 mg/dL — ABNORMAL HIGH (ref 70–99)

## 2011-10-27 LAB — RAPID URINE DRUG SCREEN, HOSP PERFORMED
Barbiturates: NOT DETECTED
Benzodiazepines: NOT DETECTED
Cocaine: NOT DETECTED
Tetrahydrocannabinol: NOT DETECTED

## 2011-10-27 MED ORDER — CLOPIDOGREL BISULFATE 75 MG PO TABS
75.0000 mg | ORAL_TABLET | Freq: Every day | ORAL | Status: DC
  Administered 2011-10-27 – 2011-10-28 (×2): 75 mg via ORAL
  Filled 2011-10-27 (×3): qty 1

## 2011-10-27 NOTE — Progress Notes (Addendum)
Subjective: S/P another neurologic event R Thalmamic CVA Noted. Recent complete Neuro W/Up Chantix helped, but still taking puffs from pipe. Not weak. NUMB Dr Johney Frame recently took him off the Norvasc 2.5 mg and BP was high yesterday.  BPs as outpt had been 110-120. I recommended that he consider retirement.  He was stung by hornets on Monday afternoon and then not too long afterwards starded having the numbness - ? If stings caused BPs to go up.  Objective: Vital signs in last 24 hours: Temp:  [97.9 F (36.6 C)-98.3 F (36.8 C)] 98.3 F (36.8 C) (08/07 0612) Pulse Rate:  [52-64] 64  (08/07 0612) Resp:  [16-20] 16  (08/07 0612) BP: (141-179)/(54-82) 141/54 mmHg (08/07 0612) SpO2:  [96 %-98 %] 97 % (08/07 0612) Weight:  [65.2 kg (143 lb 11.8 oz)] 65.2 kg (143 lb 11.8 oz) (08/06 2150) Weight change:     CBG (last 3)   Basename 10/27/11 0732 10/26/11 2159 10/26/11 1805  GLUCAP 106* 116* 112*    Intake/Output from previous day: No intake or output data in the 24 hours ending 10/27/11 0907     Physical Exam  General appearance: A and O Eyes: no scleral icterus Throat: oropharynx moist without erythema Resp: CTA Cardio: Reg GI: soft, non-tender; bowel sounds normal; no masses,  no organomegaly Extremities: no clubbing, cyanosis or edema Light touch sensory is Numb from L Forearm to hand - cannot feel coins or keys in hands.  Motor is in tact. Face symmetrical. Tongue mildly L.   Lab Results:  [facility] 10/26/11 1759  NA 136  K 3.8  CL 101  CO2 26  GLUCOSE 118*  BUN 12  CREATININE 0.87  CALCIUM 8.6  MG --  PHOS --    No results found for this basename: AST:2,ALT:2,ALKPHOS:2,BILITOT:2,PROT:2,ALBUMIN:2 in the last 72 hours   Basename 10/26/11 1759  WBC 3.7*  NEUTROABS 1.7  HGB 10.4*  HCT 30.8*  MCV 90.3  PLT 154    Lab Results  Component Value Date   INR 0.98 06/30/2011    No results found for this basename: CKTOTAL:3,CKMB:3,CKMBINDEX:3,TROPONINI:3  in the last 72 hours  No results found for this basename: TSH,T4TOTAL,FREET3,T3FREE,THYROIDAB in the last 72 hours  No results found for this basename: VITAMINB12:2,FOLATE:2,FERRITIN:2,TIBC:2,IRON:2,RETICCTPCT:2 in the last 72 hours  Micro Results: No results found for this or any previous visit (from the past 240 hour(s)).   Studies/Results: Ct Head Wo Contrast  10/26/2011  *RADIOLOGY REPORT*  Clinical Data: Left forearm and hand parasthesias, prior strokes  CT HEAD WITHOUT CONTRAST  Technique:  Contiguous axial images were obtained from the base of the skull through the vertex without contrast.  Comparison: 07/01/2011  Findings:  stable brain atrophy and patchy white matter microvascular ischemic changes.  No acute intracranial hemorrhage, definite acute infarction, mass lesion, midline shift, herniation, hydrocephalus, or extra-axial fluid collection.  Gray-white matter differentiation maintained.  Small punctate basal ganglia lacunar type infarcts.  Cisterns patent.  No cerebellar abnormality. Symmetric orbits.  Mastoids and sinuses clear.  IMPRESSION: Stable atrophy and microvascular ischemic changes.  No interval change or acute process.  Original Report Authenticated By: Judie Petit. Ruel Favors, M.D.   Mr Brain Wo Contrast  10/26/2011  *RADIOLOGY REPORT*  Clinical Data: Left sided numbness.  MRI HEAD WITHOUT CONTRAST  Technique:  Multiplanar, multiecho pulse sequences of the brain and surrounding structures were obtained according to standard protocol without intravenous contrast.  Comparison: 10/26/2011 CT.  06/30/2011 MR.  Findings: Small acute non hemorrhagic right thalamic  infarct.  Remote small basal ganglia infarcts.  Prominent small vessel disease type changes.  No intracranial hemorrhage.  No intracranial mass lesion detected on this unenhanced exam.  Global atrophy without hydrocephalus.  Major intracranial vascular structures are patent.  Cervical spondylotic changes with minimal anterior slip  of C3. Mild spinal stenosis C3-4 and C4-5.  Transverse ligament hypertrophy.  Minimal mucosal thickening inferior aspect right maxillary sinus. Artifact by dental work limits evaluation.  IMPRESSION:  Small acute non hemorrhagic right thalamic infarct.  Please see above.  Critical Value/emergent results were called by telephone at the time of interpretation on 10/26/2011 at 8:20 p.m. to Dr. Rhunette Croft, who verbally acknowledged these results.  Original Report Authenticated By: Fuller Canada, M.D.     Medications: Scheduled:    . clopidogrel  75 mg Oral Q breakfast  . multivitamin with minerals  1 tablet Oral Daily  . ramipril  10 mg Oral Daily  . vitamin C  500 mg Oral Daily  . DISCONTD: aspirin EC  325 mg Oral Daily   Continuous:    Assessment/Plan: Active Problems:  Acute cerebrovascular accident  Hypertension  Nicotine abuse  Bradycardia  Small acute non hemorrhagic right thalamic infarct affecting L arm > L leg > L cheek sensory.  Continue ASA for now.  He was stung by hornets on Monday afternoon and then not too long afterwards starded having the numbness - ? If stings caused BPs to go up. Last lipids of TC 165 TG 62 HDL 77 LDL 76  06/17/11 B Carotid stenosis ie 1-50%. Not enough to cause syncope.   Last 2D ECHO- Left ventricle: The cavity size was normal. Wall thickness was increased in a pattern of mild LVH. Systolic function was normal. The estimated ejection fraction was in the range of 55% to 60%. Wall motion was normal; there were no regional wall motion abnormalities. Left ventricular diastolic function parameters were normal. - Mitral valve: Mild regurgitation. - Pulmonary arteries: PA peak pressure: 33mm Hg (S). CTA - Small vessel changes and minimal Blockages noted.  Neruo recommended ASA 325 last time.       RF reduction.  Needs to quit pipe smoking for good.  HTN - Will need Norvasc added.  I strongly urged him to retire especially with 3 Neurologic events in 6  months..  On Tele with H/O Sinus Brady/Pauses and 17 beat run of NSVT - known to Dr Johney Frame.  He has been following as outpt.  Currently Sinus Brady HR 45-55 and Asymptomatic.  No AFib.  PACs/PVCs.  Await Dr [name].  H/O Syncope.  ANEMIA (ICD-285.9) Hbg 11.3 -13.1 with normal MCV.  Last Hbg 12.7 in April with me.  12/31/10 Colonoscopy. hemorrhoids. Divertic. Dr Ewing Schlein. Slightly more anemic today.  Will follow  ADENOCARCINOMA, BLADDER and Prostate CA - Per Dr Annabell Howells.  S/P Rx  OA off Celebrex. Back and Fingers.  Some radiculopathy. Has seen Dr Shelle Iron. L 4-5 back pain and some Neuropathic pains.      DVT Prophylaxis  Complete Medication List: 1)  Chantix Tabs (Varenicline tartrate tabs) 2)  Norvasc 2.5 Mg Tabs (Amlodipine besylate) .... D/Ced recently 3)  Altace 10 Mg Caps (Ramipril) .... Take one capsule daily 4)  Aspirin Ec 325 Mg Tbec (Aspirin) .... Take 1 tablet by mouth every day 5)  C-500 Tabs (Ascorbic acid tabs) .Marland Kitchen.. 1 tablet by mouth once a day 6)  Lyrica 75 Mg Caps (Pregabalin) .... Take one tablet qhs prn (rare use) 7)  Multivitamins Tabs (  Multiple vitamin) .... Take one tablet by mouth every day 8)  B Complex Vitamins Caps (B complex vitamins) .... Take one tablet by mouth 4 x week     LOS: 1 day   Jinger Middlesworth M 10/27/2011, 9:07 AM

## 2011-10-27 NOTE — Progress Notes (Signed)
Stroke Team Progress Note  HISTORY Philip Ward is an 71 y.o. male RHWM with multiple stroke risk factors including, B/L Carotid stenosis, as evidenced by recent carotid dopplers in a Cardiology office  He has been having perioral numbness, tongue weakness, left arm paresthesias that has been going on predominantly since last night, this morning it got worse and  He decided to come in. Patient was not a TPA candidate secondary to presenting beyond the window. He was admitted for further evaluation and treatment.  SUBJECTIVE His wife is at the bedside.  Overall he feels his condition is stable. Still with complaints of numbness left arm, has to "look" prior to picking up anything.  OBJECTIVE Most recent Vital Signs: Filed Vitals:   10/26/11 1907 10/26/11 1930 10/26/11 2150 10/27/11 0612  BP: 176/79 162/82 152/72 141/54  Pulse: 53 52 54 64  Temp:   97.9 F (36.6 C) 98.3 F (36.8 C)  TempSrc:   Oral Oral  Resp: 18 19 16 16   Weight:   65.2 kg (143 lb 11.8 oz)   SpO2: 96% 98% 98% 97%   CBG (last 3)   Basename 10/27/11 0732 10/26/11 2159 10/26/11 1805  GLUCAP 106* 116* 112*   Intake/Output from previous day:   IV Fluid Intake:     MEDICATIONS    . aspirin EC  325 mg Oral Daily  . multivitamin with minerals  1 tablet Oral Daily  . ramipril  10 mg Oral Daily  . vitamin C  500 mg Oral Daily   PRN:  acetaminophen, sodium chloride  Diet:  General thin liquids Activity:  Bathroom privileges with assistance DVT Prophylaxis:  SCDs   CLINICALLY SIGNIFICANT STUDIES Basic Metabolic Panel:  Lab 10/26/11 4098  NA 136  K 3.8  CL 101  CO2 26  GLUCOSE 118*  BUN 12  CREATININE 0.87  CALCIUM 8.6  MG --  PHOS --   CBC:  Lab 10/26/11 1759  WBC 3.7*  NEUTROABS 1.7  HGB 10.4*  HCT 30.8*  MCV 90.3  PLT 154   Lipid Panel    Component Value Date/Time   CHOL 148 10/27/2011 0555   TRIG 80 10/27/2011 0555   HDL 58 10/27/2011 0555   CHOLHDL 2.6 10/27/2011 0555   VLDL 16 10/27/2011  0555   LDLCALC 74 10/27/2011 0555   HgbA1C  Lab Results  Component Value Date   HGBA1C 5.6 07/01/2011    Urine Drug Screen:     Component Value Date/Time   LABOPIA NONE DETECTED 10/26/2011 2324   COCAINSCRNUR NONE DETECTED 10/26/2011 2324   LABBENZ NONE DETECTED 10/26/2011 2324   AMPHETMU NONE DETECTED 10/26/2011 2324   THCU NONE DETECTED 10/26/2011 2324   LABBARB NONE DETECTED 10/26/2011 2324    Alcohol Level: No results found for this basename: ETH:2 in the last 168 hours  CT of the brain  10/26/2011  Stable atrophy and microvascular ischemic changes.  No interval change or acute process.    CT angio of the head 07/02/2011 Mild intracranial atherosclerotic type changes involving cavernous segment of the internal carotid arteries bilaterally, middle cerebral artery branch vessels and less so posterior cerebral artery branch vessels. Small acute infarct posterior limb left internal capsule. No associated hemorrhage.   CT angio of the neck 07/02/2011 No evidence of hemodynamically significant stenosis of the proximal internal carotid artery on either side. Calcified plaque with less than 50% diameter stenosis distal vertical cervical segment of the right internal carotid artery. Mild to slightly moderate narrowing  of the proximal vertebral artery bilaterally. Mild to slightly moderate narrowing proximal right subclavian artery. Mild narrowing left subclavian artery. Mild narrowing origin of the great vessels. Prominent atherosclerosis with fusiform dilation and irregularity of the aortic arch incompletely assessed on the present exam. 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 Prominent cervical and upper thoracic spondylitic changes.   MRI of the brain  10/26/2011  Small acute non hemorrhagic right thalamic infarct.  MRA of the brain  07/01/2011 1. Mild small vessel disease, less than expected given the brain MRI findings. 2. No significant proximal  stenosis, aneurysm, or branch vessel occlusion.   2D Echocardiogram  06/29/2011 EF 55-60% with no source of embolus.   Carotid Doppler  05/2011 bilat L>R 50%   OP tele monitoring 08/10/2011 no afib. 17 beat NSVT while sleeping. Dr. Johney Frame  CXR    EKG  normal EKG, normal sinus rhythm, unchanged from previous tracings.   Therapy Recommendations PT - ; OT -   Physical Exam   The patient is alert and cooperative at the time of the examination.  Extraocular movements are full, visual fields are full, speech is slightly dysarthric.  The patient has good strength of all 4 extremities, the patient has good finger-nose-finger and heel-to-shin bilaterally.  The patient was not ambulated.  No drift of the upper extremities is noted.  Deep tendon reflexes are symmetric.   ASSESSMENT [name] is a 71 y.o. male with a right thalamic infarct following a left posterior left putamen and corona radiata infarct in April 2013 thought to be secondary to small vessel disease. New thalamic stroke in expected location for small vessel disease, however now given bilateral infarcts, more suspicious for embolic source. Embolic workup included OP tele monitoring. No TEE. On aspirin 325 mg orally every day prior to admission. Now on aspirin 325 mg orally every day for secondary stroke prevention. Patient with resultant left sided numbness and slurred speech.  -hypertension -CAD -Anemia, ? Source -Low WBC  The patient now has a small right thalamic infarct, in the opposite hemisphere from the prior stroke in April of 2013. The stroke could be consistent with a small vessel ischemic event, but given the bihemispheric strokes, embolic source needs to be excluded. The patient has also developed a low white blood count, and a slowly progressive anemia. Etiology of this is unknown. The patient could to require a hematology evaluation. Aggressive blood pressure management is indicated.   Hospital day #  1  TREATMENT/PLAN -Change aspirin to   clopidogrel 75 mg orally every day for secondary stroke prevention. -PT, OT evals -TEE tomorrow. Consulted [place] to perform -consider loop recorder for long-term monitoring for suspected embolic source -Check ESR, ANA  SHARON BIBY, MSN, RN, ANVP-BC, ANP-BC, GNP-BC [facility] Pager: [phone] 10/27/2011 8:38 AM  Scribe for Dr. Lesia Sago who has personally reviewed chart, pertinent data, examined the patient and developed the plan of care.  Lesly Dukes

## 2011-10-27 NOTE — Progress Notes (Signed)
Arrived in 43 via stretcher with wife at bedside.  Patient is alert and oriented and in no distress.  Vital signs remain stable. Patient has no complaints at this time.  Will continue to monitor.

## 2011-10-27 NOTE — ED Provider Notes (Signed)
I saw and evaluated the patient, reviewed the resident's note and I agree with the findings and plan.  Derwood Kaplan, MD 10/27/11 1610

## 2011-10-27 NOTE — Progress Notes (Signed)
Utilization Review Completed.  

## 2011-10-27 NOTE — Progress Notes (Signed)
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;   Presents without problems with gross motor activities; Is Independent with all mobility;   Pt's biggest concern is sensation and fine motor deficits in left hand; needs reinforcement of CVA signs and symptoms and modifiable risk factors;   No further acute PT needs;   Will sign off    PT Assessment  Patent does not need any further PT services    Follow Up Recommendations  No PT follow up    Barriers to Discharge        Equipment Recommendations  None recommended by PT    Recommendations for Other Services OT consult (as ordered)   Frequency      Precautions / Restrictions Precautions Precautions: None   Pertinent Vitals/Pain Denies pain      Mobility  Bed Mobility Bed Mobility: Supine to Sit;Sitting - Scoot to Edge of Bed Supine to Sit: 7: Independent Sitting - Scoot to [place] of Bed: 7: Independent Transfers Transfers: Sit to Stand;Stand to Sit Sit to Stand: 7: Independent Stand to Sit: 7: Independent Ambulation/Gait Ambulation/Gait Assistance: 6: Modified independent (Device/Increase time) Ambulation Distance (Feet): 5000 Feet (Greater than) Assistive device: None (noted occasionally reaching out for hallway rail) Ambulation/Gait Assistance Details: Gait speed WNL, pattern WNL; noted occasional reaching out for hallway rail; he attributes this to being mostly in bed and not walking for quite a while Gait Pattern: Within Functional Limits Gait velocity: WNL General Gait Details: No loss of balance noted Stairs: Yes Stairs Assistance: 7: Independent Stair Management Technique: No rails;Alternating pattern Number of  Stairs: 5  (including skipping steps) Modified Rankin (Stroke Patients Only) Pre-Morbid Rankin Score: No symptoms Modified Rankin: No significant disability    Exercises     PT Diagnosis:    PT Problem List:   PT Treatment Interventions:     PT Goals    Visit Information  Last PT Received On: 10/27/11 Assistance Needed: +1    Subjective Data  Subjective: "I won't wait if I have these symptoms again" Patient Stated Goal: Home   Prior Functioning  Home Living Lives With: Spouse Available Help at Discharge: Family;Available 24 hours/day Type of Home: House Home Access: Stairs to enter [employer] of Steps: 3 Entrance Stairs-Rails: None Home Layout: One level Prior Function Level of Independence: Independent Driving: Yes Vocation: Full time employment 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 for tasks performed    Extremity/Trunk Assessment Right Upper Extremity Assessment RUE ROM/Strength/Tone: Within functional levels Left Upper Extremity Assessment LUE ROM/Strength/Tone: Deficits LUE ROM/Strength/Tone Deficits: Reports paresthesias Lhand and arm; also with decr fine motor skills; Reports he must look at what he wants to pick up LUE Sensation: Deficits LUE Coordination: Deficits Right Lower Extremity Assessment RLE ROM/Strength/Tone: [facility] for tasks assessed Left Lower Extremity Assessment LLE ROM/Strength/Tone: [facility] for tasks assessed Trunk Assessment Trunk Assessment: Normal   Balance Balance Balance Assessed: Yes Static Standing Balance Static Standing - Balance Support: No upper extremity supported Static Standing - Level of Assistance: 6: Modified independent (Device/Increase time) Single Leg Stance -  Right Leg: 5  (seconds) Single Leg Stance - Left Leg: 10  (seconds) Tandem Stance - Left Leg: 30  (at least 30 seconds)  End of Session PT - End of Session Activity Tolerance: Patient tolerated treatment well Patient left: in bed;with call bell/phone within reach;with family/visitor present Nurse Communication: Mobility status  GP Functional Assessment Tool Used: Clinical judgement Functional Limitation: Mobility: Walking and moving around Mobility: Walking and Moving Around Current Status (Z6109): 0 percent impaired, limited or restricted Mobility: Walking and Moving Around Goal Status (U0454): 0 percent impaired, limited or restricted Mobility: Walking and Moving Around Discharge Status [phone]): 0 percent impaired, limited or restricted   Van Clines [facility] [place], [place] [phone]  10/27/2011, 4:25 PM

## 2011-10-28 ENCOUNTER — Encounter (HOSPITAL_COMMUNITY): Payer: Self-pay | Admitting: *Deleted

## 2011-10-28 ENCOUNTER — Encounter (HOSPITAL_COMMUNITY)
Admission: EM | Disposition: A | Discharge status: Home / Self Care | Payer: Self-pay | Source: Home / Self Care | Attending: Internal Medicine

## 2011-10-28 DIAGNOSIS — F17200 Nicotine dependence, unspecified, uncomplicated: Secondary | ICD-10-CM | POA: Diagnosis present

## 2011-10-28 DIAGNOSIS — G4730 Sleep apnea, unspecified: Secondary | ICD-10-CM | POA: Diagnosis present

## 2011-10-28 DIAGNOSIS — Z8551 Personal history of malignant neoplasm of bladder: Secondary | ICD-10-CM | POA: Diagnosis not present

## 2011-10-28 DIAGNOSIS — Z79899 Other long term (current) drug therapy: Secondary | ICD-10-CM | POA: Diagnosis not present

## 2011-10-28 DIAGNOSIS — M1990 Unspecified osteoarthritis, unspecified site: Secondary | ICD-10-CM | POA: Diagnosis present

## 2011-10-28 DIAGNOSIS — I6350 Cerebral infarction due to unspecified occlusion or stenosis of unspecified cerebral artery: Secondary | ICD-10-CM | POA: Diagnosis present

## 2011-10-28 DIAGNOSIS — Z8546 Personal history of malignant neoplasm of prostate: Secondary | ICD-10-CM | POA: Diagnosis not present

## 2011-10-28 DIAGNOSIS — IMO0002 Reserved for concepts with insufficient information to code with codable children: Secondary | ICD-10-CM | POA: Diagnosis present

## 2011-10-28 DIAGNOSIS — D649 Anemia, unspecified: Secondary | ICD-10-CM | POA: Diagnosis present

## 2011-10-28 DIAGNOSIS — I69922 Dysarthria following unspecified cerebrovascular disease: Secondary | ICD-10-CM | POA: Diagnosis not present

## 2011-10-28 DIAGNOSIS — M5412 Radiculopathy, cervical region: Secondary | ICD-10-CM | POA: Diagnosis present

## 2011-10-28 DIAGNOSIS — R471 Dysarthria and anarthria: Secondary | ICD-10-CM | POA: Diagnosis present

## 2011-10-28 DIAGNOSIS — I2510 Atherosclerotic heart disease of native coronary artery without angina pectoris: Secondary | ICD-10-CM | POA: Diagnosis present

## 2011-10-28 DIAGNOSIS — I4729 Other ventricular tachycardia: Secondary | ICD-10-CM | POA: Diagnosis present

## 2011-10-28 DIAGNOSIS — I498 Other specified cardiac arrhythmias: Secondary | ICD-10-CM | POA: Diagnosis present

## 2011-10-28 DIAGNOSIS — Z8673 Personal history of transient ischemic attack (TIA), and cerebral infarction without residual deficits: Secondary | ICD-10-CM | POA: Diagnosis not present

## 2011-10-28 DIAGNOSIS — R4182 Altered mental status, unspecified: Secondary | ICD-10-CM | POA: Diagnosis not present

## 2011-10-28 DIAGNOSIS — I10 Essential (primary) hypertension: Secondary | ICD-10-CM | POA: Diagnosis present

## 2011-10-28 DIAGNOSIS — M129 Arthropathy, unspecified: Secondary | ICD-10-CM | POA: Diagnosis present

## 2011-10-28 DIAGNOSIS — Z7982 Long term (current) use of aspirin: Secondary | ICD-10-CM | POA: Diagnosis not present

## 2011-10-28 DIAGNOSIS — I6789 Other cerebrovascular disease: Secondary | ICD-10-CM

## 2011-10-28 HISTORY — PX: TEE WITHOUT CARDIOVERSION: SHX5443

## 2011-10-28 LAB — GLUCOSE, CAPILLARY
Glucose-Capillary: 112 mg/dL — ABNORMAL HIGH (ref 70–99)
Glucose-Capillary: 106 mg/dL — ABNORMAL HIGH (ref 70–99)

## 2011-10-28 SURGERY — ECHOCARDIOGRAM, TRANSESOPHAGEAL
Anesthesia: Moderate Sedation

## 2011-10-28 MED ORDER — AMLODIPINE BESYLATE 2.5 MG PO TABS: 2.5000 mg | ORAL_TABLET | Freq: Every day | ORAL | Status: DC

## 2011-10-28 MED ORDER — FENTANYL CITRATE 0.05 MG/ML IJ SOLN
INTRAMUSCULAR | Status: DC | PRN
  Administered 2011-10-28 (×2): 25 ug via INTRAVENOUS

## 2011-10-28 MED ORDER — CLOPIDOGREL BISULFATE 75 MG PO TABS: 75.0000 mg | ORAL_TABLET | Freq: Every day | ORAL | Status: DC

## 2011-10-28 MED ORDER — SODIUM CHLORIDE 0.45 % IV SOLN
INTRAVENOUS | Status: DC
  Administered 2011-10-28: 13:00:00 via INTRAVENOUS

## 2011-10-28 MED ORDER — MIDAZOLAM HCL 10 MG/2ML IJ SOLN
INTRAMUSCULAR | Status: DC | PRN
  Administered 2011-10-28: 2 mg via INTRAVENOUS
  Administered 2011-10-28: 1 mg via INTRAVENOUS
  Administered 2011-10-28: 2 mg via INTRAVENOUS

## 2011-10-28 MED ORDER — ACETAMINOPHEN 325 MG PO TABS: 650.0000 mg | ORAL_TABLET | Freq: Four times a day (QID) | ORAL | Status: AC | PRN

## 2011-10-28 MED ORDER — BUTAMBEN-TETRACAINE-BENZOCAINE 2-2-14 % EX AERO
INHALATION_SPRAY | CUTANEOUS | Status: DC | PRN
  Administered 2011-10-28: 2 via TOPICAL

## 2011-10-28 MED ORDER — MIDAZOLAM HCL 10 MG/2ML IJ SOLN
INTRAMUSCULAR | Status: AC
  Filled 2011-10-28: qty 2

## 2011-10-28 MED ORDER — FENTANYL CITRATE 0.05 MG/ML IJ SOLN
INTRAMUSCULAR | Status: AC
  Filled 2011-10-28: qty 2

## 2011-10-28 NOTE — Discharge Summary (Signed)
Physician Discharge Summary  DISCHARGE SUMMARY   Patient ID: Philip Ward MR#: [id number] DOB/AGE: 72-May-1942 72 y.o.   Attending Physician:Laquon Emel M  Patient's QMV:HQION,GEXB M, MD  Consults:Treatment Team:  Md Stroke, MD**  Admit date: 10/26/2011 Discharge date: 10/28/2011  Discharge Diagnoses:  Active Problems:  Acute cerebrovascular accident  Hypertension  Nicotine abuse  Bradycardia   Patient Active Problem List  Diagnosis  . Acute cerebrovascular accident  . Dysarthria due to cerebrovascular accident  . Hypertension  . Irregular heartbeat  . History of syncope  . Nicotine abuse  . Bradycardia   Past Medical History  Diagnosis Date  . Hypertension   . Arthritis   . Bradycardia     chronic, asymptomatic  . Premature atrial contractions   . Syncope 3/13  . CVA (cerebral infarction) 4/13  . NSVT (nonsustained ventricular tachycardia) 08/02/11    17 beat NSVT documented on event monitor (asymptomatic)  . Coronary artery disease     hx of stroke    Discharged Condition: stable   Discharge Medications: Medication List  As of 10/28/2011  5:07 PM   STOP taking these medications         aspirin EC 325 MG tablet         TAKE these medications         acetaminophen 325 MG tablet   Commonly known as: TYLENOL   Take 2 tablets (650 mg total) by mouth every 6 (six) hours as needed for pain or fever (temperature >/= 99.5 F).      amLODipine 2.5 MG tablet   Commonly known as: NORVASC   Take 1 tablet (2.5 mg total) by mouth daily.      clopidogrel 75 MG tablet   Commonly known as: PLAVIX   Take 1 tablet (75 mg total) by mouth daily with breakfast.      multivitamin with minerals Tabs   Take 1 tablet by mouth daily.      pregabalin 75 MG capsule   Commonly known as: LYRICA   Take 75 mg by mouth 2 (two) times daily as needed. For nerve pain      ramipril 10 MG capsule   Commonly known as: ALTACE   Take 10 mg by mouth daily.      vitamin C 500  MG tablet   Commonly known as: ASCORBIC ACID   Take 500 mg by mouth daily.            Hospital Procedures: Ct Head Wo Contrast  10/26/2011  *RADIOLOGY REPORT*  Clinical Data: Left forearm and hand parasthesias, prior strokes  CT HEAD WITHOUT CONTRAST  Technique:  Contiguous axial images were obtained from the base of the skull through the vertex without contrast.  Comparison: 07/01/2011  Findings:  stable brain atrophy and patchy white matter microvascular ischemic changes.  No acute intracranial hemorrhage, definite acute infarction, mass lesion, midline shift, herniation, hydrocephalus, or extra-axial fluid collection.  Gray-white matter differentiation maintained.  Small punctate basal ganglia lacunar type infarcts.  Cisterns patent.  No cerebellar abnormality. Symmetric orbits.  Mastoids and sinuses clear.  IMPRESSION: Stable atrophy and microvascular ischemic changes.  No interval change or acute process.  Original Report Authenticated By: Judie Petit. Ruel Favors, M.D.   Mr Brain Wo Contrast  10/26/2011  *RADIOLOGY REPORT*  Clinical Data: Left sided numbness.  MRI HEAD WITHOUT CONTRAST  Technique:  Multiplanar, multiecho pulse sequences of the brain and surrounding structures were obtained according to standard protocol without intravenous contrast.  Comparison: 10/26/2011  CT.  06/30/2011 MR.  Findings: Small acute non hemorrhagic right thalamic infarct.  Remote small basal ganglia infarcts.  Prominent small vessel disease type changes.  No intracranial hemorrhage.  No intracranial mass lesion detected on this unenhanced exam.  Global atrophy without hydrocephalus.  Major intracranial vascular structures are patent.  Cervical spondylotic changes with minimal anterior slip of C3. Mild spinal stenosis C3-4 and C4-5.  Transverse ligament hypertrophy.  Minimal mucosal thickening inferior aspect right maxillary sinus. Artifact by dental work limits evaluation.  IMPRESSION:  Small acute non hemorrhagic right  thalamic infarct.  Please see above.  Critical Value/emergent results were called by telephone at the time of interpretation on 10/26/2011 at 8:20 p.m. to Dr. Rhunette Croft, who verbally acknowledged these results.  Original Report Authenticated By: Fuller Canada, M.D.    History of Present Illness: 71 year old male who had a history of 2 prior strokes including one Syncopal event, the last being in April of this year, followed by [name] as outpatient 2 months ago. 1 day prior to admit he started to note some L facial tingling and had progression into his L forearm and L leg. 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 Later in the afternoon, had his BP check ed as symptoms were worsening and the systolics were in the 150-160 range. Contacted our office and was told to go to the ER where the MRI showed a new CVA.  He notes that he still smokes a pipe from time to time, is not monitoring his BP at home routinely. States that he had been taken off Norvasc prior due to possible low BP's.   Hospital Course:  S/P R Thalmamic CVA seen on MRI. He was seen by me and in conjuction with Dr Anne Hahn.  Numbness a little better and improved in the hospital.  HR 45-55 NSR PACs PVCs - No AFib on Tele  He had a TEE today and by report was (-).  I cannot see the official report yet.  ASA was changed to Plavix.  He was seen by PT/OT and not needing further Rxs.  W/Ups included: ANA pending  CT of the brain 10/26/2011 Stable atrophy and microvascular ischemic changes. No interval change or acute process.  CT angio of the head 07/02/2011 Mild intracranial atherosclerotic type changes involving cavernous segment of the internal carotid arteries bilaterally, middle cerebral artery branch vessels and less so posterior cerebral artery branch vessels. Small acute infarct posterior limb left internal capsule. No associated hemorrhage.  CT angio of the neck 07/02/2011 No evidence of hemodynamically significant stenosis of  the proximal internal carotid artery on either side. Calcified plaque with less than 50% diameter stenosis distal vertical cervical segment of the right internal carotid artery. Mild to slightly moderate narrowing of the proximal vertebral artery bilaterally. Mild to slightly moderate narrowing proximal right subclavian artery. Mild narrowing left subclavian artery. Mild narrowing origin of the great vessels. Prominent atherosclerosis with fusiform dilation and irregularity of the aortic arch incompletely assessed on the present exam. 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 Prominent cervical and upper thoracic spondylitic changes.  MRI of the brain 10/26/2011 Small acute non hemorrhagic right thalamic infarct.  MRA of the brain 07/01/2011 1. Mild small vessel disease, less than expected given the brain MRI findings. 2. No significant proximal stenosis, aneurysm, or branch vessel occlusion.  2D Echocardiogram 06/29/2011 EF 55-60% with no source of embolus.  Carotid  Doppler 05/2011 bilat L>R 50%  OP tele monitoring 08/10/2011 no afib. 17 beat NSVT while sleeping. Dr. Johney Frame  CXR  EKG normal EKG, normal sinus rhythm, unchanged from previous tracings.  Small acute non hemorrhagic right thalamic infarct affecting L arm > L leg > L cheek sensory. Agree with Dr Anne Hahn. We Changed ASA to Plavix. TEE done today and by report (-). Recent Neuro W/ups nicely outlined in Neuro note and copy and pasted above. ESR 22, ANA P. A1C 6.2%.  The ? Is did his Hornet stings raise his BP high enough to cause a Lacunar CVA. RF reduction.  Needs to quit pipe smoking for good.  HTN - Will need Norvasc added - 1 daily and QOD.  I strongly urged him to retire especially with 3 Neurologic events in 6 months. He cannot return to work unless his feeling returns.  Asymptomatic Huston Foley s AFib. PACs/PVCs - Dr Johney Frame - no current pacer indication.  ANEMIA (ICD-285.9) Hbg 11.3 -13.1 with  normal MCV. Last Hbg 12.7 in April with me. 12/31/10 Colonoscopy. hemorrhoids. Divertic. Dr Ewing Schlein. Slightly more anemic in the hospital. Will follow. He has been anemic since 1970's and worked up thoroughly by [facility] and Hematology. Will Heme check. O/W will follow as outpt.  ADENOCARCINOMA, BLADDER and Prostate CA - Per Dr Annabell Howells. S/P Rx  OA off Celebrex. Back and Fingers. Some radiculopathy. Has seen Dr Shelle Iron.  L 4-5 back pain and some Neuropathic pains.  DVT Prophylaxis   TC 148, TG 80, HDL 58, LDL 74  After the TEE he was ready for D/c.    Day of Discharge Exam BP 166/73  Pulse 52  Temp 99.1 F (37.3 C) (Oral)  Resp 14  Wt 68.8 kg (151 lb 10.8 oz)  SpO2 95%  Physical Exam: See this am note.  Discharge Labs:  [facility] 10/26/11 1759  NA 136  K 3.8  CL 101  CO2 26  GLUCOSE 118*  BUN 12  CREATININE 0.87  CALCIUM 8.6  MG --  PHOS --   No results found for this basename: AST:2,ALT:2,ALKPHOS:2,BILITOT:2,PROT:2,ALBUMIN:2 in the last 72 hours  Basename 10/26/11 1759  WBC 3.7*  NEUTROABS 1.7  HGB 10.4*  HCT 30.8*  MCV 90.3  PLT 154   No results found for this basename: CKTOTAL:3,CKMB:3,CKMBINDEX:3,TROPONINI:3 in the last 72 hours No results found for this basename: TSH,T4TOTAL,FREET3,T3FREE,THYROIDAB in the last 72 hours No results found for this basename: VITAMINB12:2,FOLATE:2,FERRITIN:2,TIBC:2,IRON:2,RETICCTPCT:2 in the last 72 hours Lab Results  Component Value Date   INR 0.98 06/30/2011       Discharge instructions: Discharge Orders    Future Appointments: Provider: Department: Dept Phone: Center:   02/07/2012 9:00 AM [name], MD [facility] [phone] [facility]     01-Home or Self Care Follow-up Information    Follow up with Gwen Pounds, MD in 2 weeks.   Contact information:   [address], [place]. [facility] [place] [address] [phone]       Follow up with Gates Rigg, MD in 4  weeks.   Contact information:   [address] [facility] [place] [address] [phone]           Disposition: Home  Follow-up Appts: Follow-up with Dr. Timothy Lasso at [facility] in 2 weeks.  Call for appointment.  Condition on Discharge: stable  Tests Needing Follow-up: ANA  Time spent in discharge (includes decision making & examination of pt): 30 min  Signed: Gwen Pounds  10/28/2011, 5:07 PM

## 2011-10-28 NOTE — H&P (View-Only) (Signed)
Stroke Team Progress Note  HISTORY Philip Ward is an 71 y.o. male RHWM with multiple stroke risk factors including, B/L Carotid stenosis, as evidenced by recent carotid dopplers in a Cardiology office  He has been having perioral numbness, tongue weakness, left arm paresthesias that has been going on predominantly since last night, this morning it got worse and  He decided to come in. Patient was not a TPA candidate secondary to presenting beyond the window. He was admitted for further evaluation and treatment.  SUBJECTIVE Wife at bedside. TEE scheduled for 3p today. Pt unhappy with the lateness of the testing.  OBJECTIVE Most recent Vital Signs: Filed Vitals:   10/27/11 1747 10/27/11 2103 10/27/11 2155 10/28/11 0455  BP: 140/76 142/68  152/74  Pulse: 52 57  53  Temp: 98.5 F (36.9 C) 98.4 F (36.9 C)  99 F (37.2 C)  TempSrc: Oral Oral  Oral  Resp: 18 18  17   Weight:   68.8 kg (151 lb 10.8 oz)   SpO2: 98% 95%  95%   CBG (last 3)  Basename 10/27/11 2131 10/27/11 1620 10/27/11 1112  GLUCAP 103* 96 92   Intake/Output from previous day: 08/07 0701 - 08/08 0700 In: 0  Out: 1 [Urine:1] IV Fluid Intake:     MEDICATIONS    . amLODipine  2.5 mg Oral Daily  . clopidogrel  75 mg Oral Q breakfast  . multivitamin with minerals  1 tablet Oral Daily  . ramipril  10 mg Oral Daily  . vitamin C  500 mg Oral Daily  . DISCONTD: aspirin EC  325 mg Oral Daily   PRN:  acetaminophen, sodium chloride  Diet:  NPO for TEE Activity:  Bathroom privileges with assistance DVT Prophylaxis:  SCDs   CLINICALLY SIGNIFICANT STUDIES Basic Metabolic Panel:  Lab 10/26/11 1191  NA 136  K 3.8  CL 101  CO2 26  GLUCOSE 118*  BUN 12  CREATININE 0.87  CALCIUM 8.6  MG --  PHOS --   CBC:  Lab 10/26/11 1759  WBC 3.7*  NEUTROABS 1.7  HGB 10.4*  HCT 30.8*  MCV 90.3  PLT 154   Lipid Panel    Component Value Date/Time   CHOL 148 10/27/2011 0555   TRIG 80 10/27/2011 0555   HDL 58 10/27/2011  0555   CHOLHDL 2.6 10/27/2011 0555   VLDL 16 10/27/2011 0555   LDLCALC 74 10/27/2011 0555   HgbA1C  Lab Results  Component Value Date   HGBA1C 6.2* 10/27/2011    Urine Drug Screen:     Component Value Date/Time   LABOPIA NONE DETECTED 10/26/2011 2324   COCAINSCRNUR NONE DETECTED 10/26/2011 2324   LABBENZ NONE DETECTED 10/26/2011 2324   AMPHETMU NONE DETECTED 10/26/2011 2324   THCU NONE DETECTED 10/26/2011 2324   LABBARB NONE DETECTED 10/26/2011 2324    Alcohol Level: No results found for this basename: ETH:2 in the last 168 hours  Results for orders placed during the hospital encounter of 10/26/11 (from the past 24 hour(s))  GLUCOSE, CAPILLARY     Status: Normal   Collection Time   10/27/11 11:12 AM      Component Value Range   Glucose-Capillary 92  70 - 99 mg/dL  GLUCOSE, CAPILLARY     Status: Normal   Collection Time   10/27/11  4:20 PM      Component Value Range   Glucose-Capillary 96  70 - 99 mg/dL  GLUCOSE, CAPILLARY     Status: Abnormal  Collection Time   10/27/11  9:31 PM      Component Value Range   Glucose-Capillary 103 (*) 70 - 99 mg/dL  SEDIMENTATION RATE     Status: Abnormal   Collection Time   10/28/11  6:30 AM      Component Value Range   Sed Rate 22 (*) 0 - 16 mm/hr   ANA pending  CT of the brain  10/26/2011  Stable atrophy and microvascular ischemic changes.  No interval change or acute process.    CT angio of the head 07/02/2011 Mild intracranial atherosclerotic type changes involving cavernous segment of the internal carotid arteries bilaterally, middle cerebral artery branch vessels and less so posterior cerebral artery branch vessels. Small acute infarct posterior limb left internal capsule. No associated hemorrhage.   CT angio of the neck 07/02/2011 No evidence of hemodynamically significant stenosis of the proximal internal carotid artery on either side. Calcified plaque with less than 50% diameter stenosis distal vertical cervical segment of the right internal carotid  artery. Mild to slightly moderate narrowing of the proximal vertebral artery bilaterally. Mild to slightly moderate narrowing proximal right subclavian artery. Mild narrowing left subclavian artery. Mild narrowing origin of the great vessels. Prominent atherosclerosis with fusiform dilation and irregularity of the aortic arch incompletely assessed on the present exam. 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 Prominent cervical and upper thoracic spondylitic changes.   MRI of the brain  10/26/2011  Small acute non hemorrhagic right thalamic infarct.  MRA of the brain  07/01/2011 1. Mild small vessel disease, less than expected given the brain MRI findings. 2. No significant proximal stenosis, aneurysm, or branch vessel occlusion.   2D Echocardiogram  06/29/2011 EF 55-60% with no source of embolus.   Carotid Doppler  05/2011 bilat L>R 50%   OP tele monitoring 08/10/2011 no afib. 17 beat NSVT while sleeping. Dr. Johney Frame  CXR    EKG  normal EKG, normal sinus rhythm, unchanged from previous tracings.   Therapy Recommendations PT - none ; OT -   Physical Exam   The patient is alert and cooperative at the time of the examination.  Extraocular movements are full, visual fields are full, speech is slightly dysarthric, but improved from the day before.  The patient has good strength of all 4 extremities, the patient has good finger-nose-finger and heel-to-shin bilaterally.  The patient was not ambulated.  No drift of the upper extremities is noted.  Deep tendon reflexes are symmetric.  ASSESSMENT [name] is a 71 y.o. male with a right thalamic infarct following a left posterior left putamen and corona radiata infarct in April 2013 thought to be secondary to small vessel disease. New thalamic stroke in expected location for small vessel disease, however now given bilateral infarcts, more suspicious for embolic source. Embolic workup  included OP tele monitoring. No TEE. On aspirin 325 mg orally every day prior to admission. Now on aspirin 325 mg orally every day for secondary stroke prevention. Patient with resultant left sided numbness and slurred speech.  -hypertension -CAD -Anemia, ? Source. Chronic since the 70s. W/u at [facility] -Low WBC -hx adenocarcinoma bladder and prostate  The patient now has a small right thalamic infarct, in the opposite hemisphere from the prior stroke in April of 2013. The stroke could be consistent with a small vessel ischemic event, but given the bihemispheric strokes, embolic source needs to be excluded. Aggressive blood pressure management is  indicated. Anemia issue is apparently quite chronic, evaluated in the past.  Hospital day # 2  TREATMENT/PLAN -Change aspirin to   clopidogrel 75 mg orally every day for secondary stroke prevention. -OT evals -f/u ANA -TEE today to look for embolic source. Arrange with [place]. If positive for PFO (patent foramen ovale), check bilateral lower extremity venous dopplers to rule out DVT as possible source of stroke.   -consider loop recorder for long-term monitoring for suspected embolic source -ok for discharge after TEE from neuro standpoint  Annie Main, MSN, RN, ANVP-BC, ANP-BC, GNP-BC [facility] Pager: [phone] 10/28/2011 8:19 AM  Scribe for Dr. Lesia Sago who has personally reviewed chart, pertinent data, examined the patient and developed the plan of care.  Ledora Bottcher KEITH

## 2011-10-28 NOTE — Evaluation (Signed)
Occupational Therapy Evaluation and Discharge Patient Details Name: ADELAIDO NICKLAUS MRN: [id number] DOB: 1940-12-30 Today's Date: 10/28/2011 Time: 4098-1191 OT Time Calculation (min): 12 min  OT Assessment / Plan / Recommendation Clinical Impression  Pt admitted with perioral numbness, tongue weakness, left arm paresthesias. Pt found to have small right thalamic infarct. Pt continues with facial and LUE paresthesias but feels these are slowly improving. All education completed and pt with no further acute OT needs at this time. Will sign off.    OT Assessment  Patient does not need any further OT services    Follow Up Recommendations  No OT follow up    Barriers to Discharge      Equipment Recommendations  None recommended by PT;None recommended by OT    Recommendations for Other Services    Frequency       Precautions / Restrictions Precautions Precautions: None   Pertinent Vitals/Pain Pt denies any pain during eval    ADL  Eating/Feeding: Performed;Modified independent (inc attempts and time secondary to LUE sensory deficits) Where Assessed - Eating/Feeding: Edge of bed Grooming: Performed;Modified independent (inc time) Where Assessed - Grooming: Unsupported standing Upper Body Bathing: Simulated;Independent Where Assessed - Upper Body Bathing: Unsupported sitting Lower Body Bathing: Simulated;Independent Where Assessed - Lower Body Bathing: Unsupported standing Lower Body Dressing: Performed;Modified independent (incr time and effort) Where Assessed - Lower Body Dressing: Unsupported sit to stand Toilet Transfer: Performed;Independent Toilet Transfer Method: Sit to Barista: Regular height toilet Transfers/Ambulation Related to ADLs: I with ambulation throughout room; pt reports he has been I'ly getting up and down to use bathrrom all night ADL Comments: Pt with left facial and left elbow and distally sensory deficits. Pt educated on  desensitization as well as compensatory methods (e.g. look at hand while using, grip harder initially for sensory input and grade to lighter grasp as able). Pt with no other deficits noted.    OT Diagnosis:    OT Problem List:   OT Treatment Interventions:     OT Goals    Visit Information  Last OT Received On: 10/28/11 Assistance Needed: +1    Subjective Data  Subjective: I kind of assessed myself Patient Stated Goal: Return home and to work   Prior Functioning  Vision/Perception  Home Living Lives With: Spouse Available Help at Discharge: Family;Available 24 hours/day Type of Home: House Home Access: Stairs to enter [employer] of Steps: 3 Entrance Stairs-Rails: None Home Layout: One level Bathroom Shower/Tub: Engineer, manufacturing systems: Standard Prior Function Level of Independence: Independent Driving: Yes Vocation: Full time employment Comments: [employer] Communication Communication: No difficulties (slight dysarthria secondary to left facial decr sensation) Dominant Hand: Right      Cognition  Overall Cognitive Status: Appears within functional limits for tasks assessed/performed Arousal/Alertness: Awake/alert Orientation Level: Oriented X4 / Intact Behavior During Session: [facility] for tasks performed    Extremity/Trunk Assessment Right Upper Extremity Assessment RUE ROM/Strength/Tone: Within functional levels RUE Sensation: WFL - Light Touch RUE Coordination: WFL - gross/fine motor Left Upper Extremity Assessment LUE ROM/Strength/Tone: Within functional levels LUE Sensation: Deficits LUE Sensation Deficits: pt reports paresthesias in LUE elbow and distally with thumb and digits 1-4 most effected. Pt is able to discriminate hot/cold.  LUE Coordination: WFL - gross/fine motor   Mobility Bed Mobility Bed Mobility: Supine to Sit;Sitting - Scoot to Edge of Bed Supine to Sit: 7: Independent Sitting - Scoot to Edge of Bed: 7:  Independent Transfers Sit to Stand: 7: Independent Stand  to Sit: 7: Independent   Exercise    Balance    End of Session OT - End of Session Activity Tolerance: Patient tolerated treatment well Patient left: in bed;with call bell/phone within reach;with family/visitor present  GO     Geeta Dworkin 10/28/2011, 11:29 AM

## 2011-10-28 NOTE — Progress Notes (Signed)
Subjective: S/P R Thalmamic CVA. Numbness a little better. HR 45-55 NSR PACs PVCs - No AFib. For TEE today. He is frustrated @ timing of TEE.  Now on Plavix.  Objective: Vital signs in last 24 hours: Temp:  [98.2 F (36.8 C)-99 F (37.2 C)] 99 F (37.2 C) (08/08 0455) Pulse Rate:  [52-59] 53  (08/08 0455) Resp:  [17-18] 17  (08/08 0455) BP: (140-152)/(55-76) 152/74 mmHg (08/08 0455) SpO2:  [95 %-98 %] 95 % (08/08 0455) Weight:  [68.8 kg (151 lb 10.8 oz)] 68.8 kg (151 lb 10.8 oz) (08/07 2155) Weight change: 3.6 kg (7 lb 15 oz)    CBG (last 3)   Basename 10/27/11 2131 10/27/11 1620 10/27/11 1112  GLUCAP 103* 96 92    Intake/Output from previous day:  Intake/Output Summary (Last 24 hours) at 10/28/11 0748 Last data filed at 10/27/11 1700  Gross per 24 hour  Intake      0 ml  Output      1 ml  Net     -1 ml   08/07 0701 - 08/08 0700 In: 0  Out: 1 [Urine:1]   Physical Exam  General appearance: A and O Eyes: no scleral icterus Throat: oropharynx moist without erythema Resp: CTA Cardio: Reg GI: soft, non-tender; bowel sounds normal; no masses,  no organomegaly Extremities: no clubbing, cyanosis or edema Light touch sensory is Numb from L Forearm to hand - cannot feel coins or keys in hands.  Motor is in tact. Face symmetrical. Tongue mildly L. Fine Motor a little better.   Lab Results:  [facility] 10/26/11 1759  NA 136  K 3.8  CL 101  CO2 26  GLUCOSE 118*  BUN 12  CREATININE 0.87  CALCIUM 8.6  MG --  PHOS --    No results found for this basename: AST:2,ALT:2,ALKPHOS:2,BILITOT:2,PROT:2,ALBUMIN:2 in the last 72 hours   Basename 10/26/11 1759  WBC 3.7*  NEUTROABS 1.7  HGB 10.4*  HCT 30.8*  MCV 90.3  PLT 154    Lab Results  Component Value Date   INR 0.98 06/30/2011    No results found for this basename: CKTOTAL:3,CKMB:3,CKMBINDEX:3,TROPONINI:3 in the last 72 hours  No results found for this basename: TSH,T4TOTAL,FREET3,T3FREE,THYROIDAB in  the last 72 hours  No results found for this basename: VITAMINB12:2,FOLATE:2,FERRITIN:2,TIBC:2,IRON:2,RETICCTPCT:2 in the last 72 hours  Micro Results: No results found for this or any previous visit (from the past 240 hour(s)).   Studies/Results: Ct Head Wo Contrast  10/26/2011  *RADIOLOGY REPORT*  Clinical Data: Left forearm and hand parasthesias, prior strokes  CT HEAD WITHOUT CONTRAST  Technique:  Contiguous axial images were obtained from the base of the skull through the vertex without contrast.  Comparison: 07/01/2011  Findings:  stable brain atrophy and patchy white matter microvascular ischemic changes.  No acute intracranial hemorrhage, definite acute infarction, mass lesion, midline shift, herniation, hydrocephalus, or extra-axial fluid collection.  Gray-white matter differentiation maintained.  Small punctate basal ganglia lacunar type infarcts.  Cisterns patent.  No cerebellar abnormality. Symmetric orbits.  Mastoids and sinuses clear.  IMPRESSION: Stable atrophy and microvascular ischemic changes.  No interval change or acute process.  Original Report Authenticated By: Judie Petit. Ruel Favors, M.D.   Mr Brain Wo Contrast  10/26/2011  *RADIOLOGY REPORT*  Clinical Data: Left sided numbness.  MRI HEAD WITHOUT CONTRAST  Technique:  Multiplanar, multiecho pulse sequences of the brain and surrounding structures were obtained according to standard protocol without intravenous contrast.  Comparison: 10/26/2011 CT.  06/30/2011 MR.  Findings:  Small acute non hemorrhagic right thalamic infarct.  Remote small basal ganglia infarcts.  Prominent small vessel disease type changes.  No intracranial hemorrhage.  No intracranial mass lesion detected on this unenhanced exam.  Global atrophy without hydrocephalus.  Major intracranial vascular structures are patent.  Cervical spondylotic changes with minimal anterior slip of C3. Mild spinal stenosis C3-4 and C4-5.  Transverse ligament hypertrophy.  Minimal mucosal  thickening inferior aspect right maxillary sinus. Artifact by dental work limits evaluation.  IMPRESSION:  Small acute non hemorrhagic right thalamic infarct.  Please see above.  Critical Value/emergent results were called by telephone at the time of interpretation on 10/26/2011 at 8:20 p.m. to Dr. Rhunette Croft, who verbally acknowledged these results.  Original Report Authenticated By: Fuller Canada, M.D.     Medications: Scheduled:    . clopidogrel  75 mg Oral Q breakfast  . multivitamin with minerals  1 tablet Oral Daily  . ramipril  10 mg Oral Daily  . vitamin C  500 mg Oral Daily  . DISCONTD: aspirin EC  325 mg Oral Daily   Continuous:    Assessment/Plan: Active Problems:  Acute cerebrovascular accident  Hypertension  Nicotine abuse  Bradycardia  Small acute non hemorrhagic right thalamic infarct affecting L arm > L leg > L cheek sensory.  Agree with Dr Anne Hahn.  Changed ASA to Plavix.  TEE today. Recent Neuro W/ups nicely outlined in Neuro note.  Check ESR, ANA.  Await OT.  RF reduction.  Needs to quit pipe smoking for good.  HTN - Will need Norvasc added - maybe tomorrow.  I strongly urged him to retire especially with 3 Neurologic events in 6 months..   Asymptomatic Huston Foley s AFib.  PACs/PVCs - Dr Johney Frame - no current pacer indication.  ANEMIA (ICD-285.9) Hbg 11.3 -13.1 with normal MCV.  Last Hbg 12.7 in April with me.  12/31/10 Colonoscopy. hemorrhoids. Divertic. Dr Ewing Schlein. Slightly more anemic today.  Will follow.  He has been anemic since 1970's and worked up thoroughly by [facility] and Hematology.  Will Heme check.  O/W will follow as outpt.  ADENOCARCINOMA, BLADDER and Prostate CA - Per Dr Annabell Howells.  S/P Rx  OA off Celebrex. Back and Fingers.  Some radiculopathy. Has seen Dr Shelle Iron. L 4-5 back pain and some Neuropathic pains.      DVT Prophylaxis      LOS: 2 days   Quency Tober M 10/28/2011, 7:48 AM

## 2011-10-28 NOTE — CV Procedure (Signed)
See report in [employer]; Normal LV function; moderate atherosclerosis descending aorta.

## 2011-10-28 NOTE — Progress Notes (Signed)
Stroke Team Progress Note  HISTORY Philip Ward is an 71 y.o. male RHWM with multiple stroke risk factors including, B/L Carotid stenosis, as evidenced by recent carotid dopplers in a Cardiology office  He has been having perioral numbness, tongue weakness, left arm paresthesias that has been going on predominantly since last night, this morning it got worse and  He decided to come in. Patient was not a TPA candidate secondary to presenting beyond the window. He was admitted for further evaluation and treatment.  SUBJECTIVE Wife at bedside. TEE scheduled for 3p today. Pt unhappy with the lateness of the testing.  OBJECTIVE Most recent Vital Signs: Filed Vitals:   10/27/11 1747 10/27/11 2103 10/27/11 2155 10/28/11 0455  BP: 140/76 142/68  152/74  Pulse: 52 57  53  Temp: 98.5 F (36.9 C) 98.4 F (36.9 C)  99 F (37.2 C)  TempSrc: Oral Oral  Oral  Resp: 18 18  17  Weight:   68.8 kg (151 lb 10.8 oz)   SpO2: 98% 95%  95%   CBG (last 3)  Basename 10/27/11 2131 10/27/11 1620 10/27/11 1112  GLUCAP 103* 96 92   Intake/Output from previous day: 08/07 0701 - 08/08 0700 In: 0  Out: 1 [Urine:1] IV Fluid Intake:     MEDICATIONS    . amLODipine  2.5 mg Oral Daily  . clopidogrel  75 mg Oral Q breakfast  . multivitamin with minerals  1 tablet Oral Daily  . ramipril  10 mg Oral Daily  . vitamin C  500 mg Oral Daily  . DISCONTD: aspirin EC  325 mg Oral Daily   PRN:  acetaminophen, sodium chloride  Diet:  NPO for TEE Activity:  Bathroom privileges with assistance DVT Prophylaxis:  SCDs   CLINICALLY SIGNIFICANT STUDIES Basic Metabolic Panel:  Lab 10/26/11 1759  NA 136  K 3.8  CL 101  CO2 26  GLUCOSE 118*  BUN 12  CREATININE 0.87  CALCIUM 8.6  MG --  PHOS --   CBC:  Lab 10/26/11 1759  WBC 3.7*  NEUTROABS 1.7  HGB 10.4*  HCT 30.8*  MCV 90.3  PLT 154   Lipid Panel    Component Value Date/Time   CHOL 148 10/27/2011 0555   TRIG 80 10/27/2011 0555   HDL 58 10/27/2011  0555   CHOLHDL 2.6 10/27/2011 0555   VLDL 16 10/27/2011 0555   LDLCALC 74 10/27/2011 0555   HgbA1C  Lab Results  Component Value Date   HGBA1C 6.2* 10/27/2011    Urine Drug Screen:     Component Value Date/Time   LABOPIA NONE DETECTED 10/26/2011 2324   COCAINSCRNUR NONE DETECTED 10/26/2011 2324   LABBENZ NONE DETECTED 10/26/2011 2324   AMPHETMU NONE DETECTED 10/26/2011 2324   THCU NONE DETECTED 10/26/2011 2324   LABBARB NONE DETECTED 10/26/2011 2324    Alcohol Level: No results found for this basename: ETH:2 in the last 168 hours  Results for orders placed during the hospital encounter of 10/26/11 (from the past 24 hour(s))  GLUCOSE, CAPILLARY     Status: Normal   Collection Time   10/27/11 11:12 AM      Component Value Range   Glucose-Capillary 92  70 - 99 mg/dL  GLUCOSE, CAPILLARY     Status: Normal   Collection Time   10/27/11  4:20 PM      Component Value Range   Glucose-Capillary 96  70 - 99 mg/dL  GLUCOSE, CAPILLARY     Status: Abnormal     Collection Time   10/27/11  9:31 PM      Component Value Range   Glucose-Capillary 103 (*) 70 - 99 mg/dL  SEDIMENTATION RATE     Status: Abnormal   Collection Time   10/28/11  6:30 AM      Component Value Range   Sed Rate 22 (*) 0 - 16 mm/hr   ANA pending  CT of the brain  10/26/2011  Stable atrophy and microvascular ischemic changes.  No interval change or acute process.    CT angio of the head 07/02/2011 Mild intracranial atherosclerotic type changes involving cavernous segment of the internal carotid arteries bilaterally, middle cerebral artery branch vessels and less so posterior cerebral artery branch vessels. Small acute infarct posterior limb left internal capsule. No associated hemorrhage.   CT angio of the neck 07/02/2011 No evidence of hemodynamically significant stenosis of the proximal internal carotid artery on either side. Calcified plaque with less than 50% diameter stenosis distal vertical cervical segment of the right internal carotid  artery. Mild to slightly moderate narrowing of the proximal vertebral artery bilaterally. Mild to slightly moderate narrowing proximal right subclavian artery. Mild narrowing left subclavian artery. Mild narrowing origin of the great vessels. Prominent atherosclerosis with fusiform dilation and irregularity of the aortic arch incompletely assessed on the present exam. 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 Prominent cervical and upper thoracic spondylitic changes.   MRI of the brain  10/26/2011  Small acute non hemorrhagic right thalamic infarct.  MRA of the brain  07/01/2011 1. Mild small vessel disease, less than expected given the brain MRI findings. 2. No significant proximal stenosis, aneurysm, or branch vessel occlusion.   2D Echocardiogram  06/29/2011 EF 55-60% with no source of embolus.   Carotid Doppler  05/2011 bilat L>R 50%   OP tele monitoring 08/10/2011 no afib. 17 beat NSVT while sleeping. Dr. Allred  CXR    EKG  normal EKG, normal sinus rhythm, unchanged from previous tracings.   Therapy Recommendations PT - none ; OT -   Physical Exam   The patient is alert and cooperative at the time of the examination.  Extraocular movements are full, visual fields are full, speech is slightly dysarthric, but improved from the day before.  The patient has good strength of all 4 extremities, the patient has good finger-nose-finger and heel-to-shin bilaterally.  The patient was not ambulated.  No drift of the upper extremities is noted.  Deep tendon reflexes are symmetric.  ASSESSMENT [name] is a 71 y.o. male with a right thalamic infarct following a left posterior left putamen and corona radiata infarct in April 2013 thought to be secondary to small vessel disease. New thalamic stroke in expected location for small vessel disease, however now given bilateral infarcts, more suspicious for embolic source. Embolic workup  included OP tele monitoring. No TEE. On aspirin 325 mg orally every day prior to admission. Now on aspirin 325 mg orally every day for secondary stroke prevention. Patient with resultant left sided numbness and slurred speech.  -hypertension -CAD -Anemia, ? Source. Chronic since the 70s. W/u at [facility] -Low WBC -hx adenocarcinoma bladder and prostate  The patient now has a small right thalamic infarct, in the opposite hemisphere from the prior stroke in April of 2013. The stroke could be consistent with a small vessel ischemic event, but given the bihemispheric strokes, embolic source needs to be excluded. Aggressive blood pressure management is   indicated. Anemia issue is apparently quite chronic, evaluated in the past.  Hospital day # 2  TREATMENT/PLAN -Change aspirin to   clopidogrel 75 mg orally every day for secondary stroke prevention. -OT evals -f/u ANA -TEE today to look for embolic source. Arrange with [place]. If positive for PFO (patent foramen ovale), check bilateral lower extremity venous dopplers to rule out DVT as possible source of stroke.   -consider loop recorder for long-term monitoring for suspected embolic source -ok for discharge after TEE from neuro standpoint  SHARON BIBY, MSN, RN, ANVP-BC, ANP-BC, GNP-BC [facility] Pager: [phone] 10/28/2011 8:19 AM  Scribe for Dr. Keith Willis who has personally reviewed chart, pertinent data, examined the patient and developed the plan of care.  BIBY,SHARON  WILLIS,CHARLES KEITH   

## 2011-10-28 NOTE — Progress Notes (Signed)
Pt return from endo. Pt is alert and oriented per baseline. Pt denies c/o of pain.

## 2011-10-28 NOTE — Interval H&P Note (Signed)
History and Physical Interval Note:  10/28/2011 1:55 PM  Philip Ward  has presented today for surgery, with the diagnosis of stroke  The various methods of treatment have been discussed with the patient and family. After consideration of risks, benefits and other options for treatment, the patient has consented to  Procedure(s) (LRB): TRANSESOPHAGEAL ECHOCARDIOGRAM (TEE) (N/A) as a surgical intervention .  The patient's history has been reviewed, patient examined, no change in status, stable for surgery.  I have reviewed the patient's chart and labs.  Questions were answered to the patient's satisfaction.     Olga Millers

## 2011-10-28 NOTE — Progress Notes (Signed)
  Echocardiogram Echocardiogram Transesophageal has been performed.  Philip Ward 10/28/2011, 4:01 PM

## 2011-10-29 ENCOUNTER — Encounter (HOSPITAL_COMMUNITY): Payer: Self-pay

## 2011-10-29 ENCOUNTER — Encounter (HOSPITAL_COMMUNITY): Payer: Self-pay | Admitting: Cardiology

## 2011-11-17 DIAGNOSIS — I6350 Cerebral infarction due to unspecified occlusion or stenosis of unspecified cerebral artery: Secondary | ICD-10-CM | POA: Diagnosis not present

## 2011-11-26 DIAGNOSIS — I498 Other specified cardiac arrhythmias: Secondary | ICD-10-CM | POA: Diagnosis not present

## 2011-11-26 DIAGNOSIS — I6350 Cerebral infarction due to unspecified occlusion or stenosis of unspecified cerebral artery: Secondary | ICD-10-CM | POA: Diagnosis not present

## 2011-11-26 DIAGNOSIS — I10 Essential (primary) hypertension: Secondary | ICD-10-CM | POA: Diagnosis not present

## 2011-11-26 DIAGNOSIS — M1990 Unspecified osteoarthritis, unspecified site: Secondary | ICD-10-CM | POA: Diagnosis not present

## 2011-12-09 DIAGNOSIS — C61 Malignant neoplasm of prostate: Secondary | ICD-10-CM | POA: Diagnosis not present

## 2012-02-07 ENCOUNTER — Ambulatory Visit: Payer: Medicare Other | Admitting: Internal Medicine

## 2012-02-11 DIAGNOSIS — C61 Malignant neoplasm of prostate: Secondary | ICD-10-CM | POA: Diagnosis not present

## 2012-02-14 DIAGNOSIS — I6350 Cerebral infarction due to unspecified occlusion or stenosis of unspecified cerebral artery: Secondary | ICD-10-CM | POA: Diagnosis not present

## 2012-02-28 DIAGNOSIS — C61 Malignant neoplasm of prostate: Secondary | ICD-10-CM | POA: Diagnosis not present

## 2012-02-28 DIAGNOSIS — Z8551 Personal history of malignant neoplasm of bladder: Secondary | ICD-10-CM | POA: Diagnosis not present

## 2012-02-28 DIAGNOSIS — R972 Elevated prostate specific antigen [PSA]: Secondary | ICD-10-CM | POA: Diagnosis not present

## 2012-02-28 DIAGNOSIS — N529 Male erectile dysfunction, unspecified: Secondary | ICD-10-CM | POA: Diagnosis not present

## 2012-03-01 ENCOUNTER — Ambulatory Visit: Payer: Medicare Other | Admitting: Internal Medicine

## 2012-03-06 DIAGNOSIS — I10 Essential (primary) hypertension: Secondary | ICD-10-CM | POA: Diagnosis not present

## 2012-03-06 DIAGNOSIS — Z1331 Encounter for screening for depression: Secondary | ICD-10-CM | POA: Diagnosis not present

## 2012-03-06 DIAGNOSIS — K3189 Other diseases of stomach and duodenum: Secondary | ICD-10-CM | POA: Diagnosis not present

## 2012-03-06 DIAGNOSIS — R7309 Other abnormal glucose: Secondary | ICD-10-CM | POA: Diagnosis not present

## 2012-03-09 ENCOUNTER — Ambulatory Visit (INDEPENDENT_AMBULATORY_CARE_PROVIDER_SITE_OTHER): Payer: Medicare Other | Admitting: Internal Medicine

## 2012-03-09 ENCOUNTER — Encounter: Payer: Self-pay | Admitting: Internal Medicine

## 2012-03-09 VITALS — BP 142/64 | HR 53 | Ht 72.0 in | Wt 141.0 lb

## 2012-03-09 DIAGNOSIS — I6350 Cerebral infarction due to unspecified occlusion or stenosis of unspecified cerebral artery: Secondary | ICD-10-CM

## 2012-03-09 DIAGNOSIS — Z9189 Other specified personal risk factors, not elsewhere classified: Secondary | ICD-10-CM | POA: Diagnosis not present

## 2012-03-09 DIAGNOSIS — I639 Cerebral infarction, unspecified: Secondary | ICD-10-CM

## 2012-03-09 DIAGNOSIS — Z87898 Personal history of other specified conditions: Secondary | ICD-10-CM

## 2012-03-09 DIAGNOSIS — R001 Bradycardia, unspecified: Secondary | ICD-10-CM

## 2012-03-09 DIAGNOSIS — I498 Other specified cardiac arrhythmias: Secondary | ICD-10-CM | POA: Diagnosis not present

## 2012-03-09 DIAGNOSIS — I10 Essential (primary) hypertension: Secondary | ICD-10-CM | POA: Diagnosis not present

## 2012-03-09 NOTE — Assessment & Plan Note (Signed)
I continue to think that this may have been due to postural hypotension.  I would therefore not recommend aggressive blood pressure control long at this time. No changes are planned.  He will contact me immediately if he has further syncope.

## 2012-03-09 NOTE — Progress Notes (Signed)
PCP: Gwen Pounds, MD  Philip Ward is a 71 y.o. male who presents today for routine electrophysiology followup.  Since last being seen in our clinic, the patient reports doing very well.  He has been able to return to work 2 days per week.  He has resumed activity without any perceived limitation.  Today, he denies symptoms of palpitations, chest pain, shortness of breath,  lower extremity edema, dizziness, presyncope, or syncope.  The patient is otherwise without complaint today.   Past Medical History  Diagnosis Date  . Hypertension   . Arthritis   . Bradycardia     chronic, asymptomatic  . Premature atrial contractions   . Syncope 3/13  . CVA (cerebral infarction) 4/13  . NSVT (nonsustained ventricular tachycardia) 08/02/11    17 beat NSVT documented on event monitor (asymptomatic)  . Coronary artery disease     hx of stroke   Past Surgical History  Procedure Date  . Transurethral resection of bladder 05/08/2010    bladder Ca  . Tee without cardioversion 10/28/2011    Procedure: TRANSESOPHAGEAL ECHOCARDIOGRAM (TEE);  Surgeon: Lewayne Bunting, MD;  Location: [facility] ENDOSCOPY;  Service: Cardiovascular;  Laterality: N/A;    Current Outpatient Prescriptions  Medication Sig Dispense Refill  . acetaminophen (TYLENOL) 325 MG tablet Take 2 tablets (650 mg total) by mouth every 6 (six) hours as needed for pain or fever (temperature >/= 99.5 F).      Marland Kitchen amLODipine (NORVASC) 2.5 MG tablet Take 1 tablet (2.5 mg total) by mouth daily.      Marland Kitchen aspirin 81 MG tablet Take 81 mg by mouth daily.      . clopidogrel (PLAVIX) 75 MG tablet Take 1 tablet (75 mg total) by mouth daily with breakfast.      . Multiple Vitamin (MULITIVITAMIN WITH MINERALS) TABS Take 1 tablet by mouth daily.      . pregabalin (LYRICA) 75 MG capsule Take 75 mg by mouth 2 (two) times daily as needed. For nerve pain      . ramipril (ALTACE) 10 MG capsule Take 10 mg by mouth daily.      . vitamin C (ASCORBIC ACID) 500 MG tablet  Take 500 mg by mouth daily.        Physical Exam: Filed Vitals:   03/09/12 1124  BP: 142/64  Pulse: 53  Height: 6' (1.829 m)  Weight: 141 lb (63.957 kg)    GEN- The patient is well appearing, alert and oriented x 3 today.   Head- normocephalic, atraumatic Eyes-  Sclera clear, conjunctiva pink Ears- hearing intact Oropharynx- clear Lungs- Clear to ausculation bilaterally, normal work of breathing Heart- Regular rate and rhythm, no murmurs, rubs or gallops, PMI not laterally displaced GI- soft, NT, ND, + BS Extremities- no clubbing, cyanosis, or edema  ekg today reveals sinus bradycardia 53 bpm, PR , LVH  Assessment and Plan:

## 2012-03-09 NOTE — Patient Instructions (Signed)
Your physician wants you to follow-up in: 12 months with Dr Allred You will receive a reminder letter in the mail two months in advance. If you don't receive a letter, please call our office to schedule the follow-up appointment.  

## 2012-03-09 NOTE — Assessment & Plan Note (Signed)
Stable No change required today  

## 2012-03-09 NOTE — Assessment & Plan Note (Signed)
30 day event monitor did not previously reveal any afib. Today, I offered that we could place an implantable loop recorder to more definitively exclude afib.  He is very clear in his decision to decline.  We will follow this clinically.  Continue ASA and Plavix as per neurology.

## 2012-05-18 DIAGNOSIS — R209 Unspecified disturbances of skin sensation: Secondary | ICD-10-CM | POA: Diagnosis not present

## 2012-05-18 DIAGNOSIS — I6350 Cerebral infarction due to unspecified occlusion or stenosis of unspecified cerebral artery: Secondary | ICD-10-CM | POA: Diagnosis not present

## 2012-05-22 DIAGNOSIS — C61 Malignant neoplasm of prostate: Secondary | ICD-10-CM | POA: Diagnosis not present

## 2012-06-22 DIAGNOSIS — C61 Malignant neoplasm of prostate: Secondary | ICD-10-CM | POA: Diagnosis not present

## 2012-06-22 DIAGNOSIS — R972 Elevated prostate specific antigen [PSA]: Secondary | ICD-10-CM | POA: Diagnosis not present

## 2012-06-22 DIAGNOSIS — C672 Malignant neoplasm of lateral wall of bladder: Secondary | ICD-10-CM | POA: Diagnosis not present

## 2012-06-22 DIAGNOSIS — Z8551 Personal history of malignant neoplasm of bladder: Secondary | ICD-10-CM | POA: Diagnosis not present

## 2012-06-22 DIAGNOSIS — R82998 Other abnormal findings in urine: Secondary | ICD-10-CM | POA: Diagnosis not present

## 2012-06-22 DIAGNOSIS — N401 Enlarged prostate with lower urinary tract symptoms: Secondary | ICD-10-CM | POA: Diagnosis not present

## 2012-09-18 DIAGNOSIS — I10 Essential (primary) hypertension: Secondary | ICD-10-CM | POA: Diagnosis not present

## 2012-09-27 DIAGNOSIS — Z1331 Encounter for screening for depression: Secondary | ICD-10-CM | POA: Diagnosis not present

## 2012-09-27 DIAGNOSIS — F17200 Nicotine dependence, unspecified, uncomplicated: Secondary | ICD-10-CM | POA: Diagnosis not present

## 2012-09-27 DIAGNOSIS — I498 Other specified cardiac arrhythmias: Secondary | ICD-10-CM | POA: Diagnosis not present

## 2012-09-27 DIAGNOSIS — I6350 Cerebral infarction due to unspecified occlusion or stenosis of unspecified cerebral artery: Secondary | ICD-10-CM | POA: Diagnosis not present

## 2012-09-27 DIAGNOSIS — C679 Malignant neoplasm of bladder, unspecified: Secondary | ICD-10-CM | POA: Diagnosis not present

## 2012-09-27 DIAGNOSIS — R7309 Other abnormal glucose: Secondary | ICD-10-CM | POA: Diagnosis not present

## 2012-09-27 DIAGNOSIS — Z1212 Encounter for screening for malignant neoplasm of rectum: Secondary | ICD-10-CM | POA: Diagnosis not present

## 2012-09-27 DIAGNOSIS — Z0000 Encounter for general adult medical examination without abnormal findings: Secondary | ICD-10-CM | POA: Diagnosis not present

## 2012-09-27 DIAGNOSIS — C61 Malignant neoplasm of prostate: Secondary | ICD-10-CM | POA: Diagnosis not present

## 2012-09-27 DIAGNOSIS — L723 Sebaceous cyst: Secondary | ICD-10-CM | POA: Diagnosis not present

## 2012-10-03 DIAGNOSIS — H2510 Age-related nuclear cataract, unspecified eye: Secondary | ICD-10-CM | POA: Diagnosis not present

## 2012-10-03 DIAGNOSIS — H43819 Vitreous degeneration, unspecified eye: Secondary | ICD-10-CM | POA: Diagnosis not present

## 2012-10-26 ENCOUNTER — Ambulatory Visit (INDEPENDENT_AMBULATORY_CARE_PROVIDER_SITE_OTHER): Payer: Medicare Other | Admitting: Nurse Practitioner

## 2012-10-26 ENCOUNTER — Encounter: Payer: Self-pay | Admitting: Nurse Practitioner

## 2012-10-26 VITALS — BP 134/63 | HR 58 | Ht 70.0 in | Wt 142.0 lb

## 2012-10-26 DIAGNOSIS — I6350 Cerebral infarction due to unspecified occlusion or stenosis of unspecified cerebral artery: Secondary | ICD-10-CM

## 2012-10-26 DIAGNOSIS — F17200 Nicotine dependence, unspecified, uncomplicated: Secondary | ICD-10-CM

## 2012-10-26 DIAGNOSIS — Z720 Tobacco use: Secondary | ICD-10-CM

## 2012-10-26 DIAGNOSIS — I10 Essential (primary) hypertension: Secondary | ICD-10-CM

## 2012-10-26 MED ORDER — CLOPIDOGREL BISULFATE 75 MG PO TABS: 75.0000 mg | ORAL_TABLET | Freq: Every day | ORAL | Status: DC

## 2012-10-26 NOTE — Patient Instructions (Addendum)
Continue Plavix for secondary stroke prevention, will renew RX   Strict control of blood pressure with systolic less than 130 Followup in 6 months next visit with Dr. [name]

## 2012-10-26 NOTE — Progress Notes (Signed)
  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 Vascular risk factors of smoking and hypertension only. 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 He returns for followup after last visit on  05/18/12. He continues to have residual paresthesias on his left face and left hand. He has retired from  work.He still has proprioceptive difficulties in the left hand but has learned to cope  using his vision to help. He has cut back his smoking of the  pipe but has not quit completely. His blood pressure is under good control. He has no new complaints. He continues to tolerate Plavix well without side effects. He has had no further stroke symptoms    Medications Current Outpatient Prescriptions on File Prior to Visit  Medication Sig Dispense Refill  . acetaminophen (TYLENOL) 325 MG tablet Take 2 tablets (650 mg total) by mouth every 6 (six) hours as needed for pain or fever (temperature >/= 99.5 F).      Marland Kitchen aspirin 81 MG tablet Take 81 mg by mouth daily.      . clopidogrel (PLAVIX) 75 MG tablet Take 1 tablet (75 mg total) by mouth daily with breakfast.      . Multiple Vitamin (MULITIVITAMIN WITH MINERALS) TABS Take 1 tablet by mouth daily.      . ramipril (ALTACE) 10 MG capsule Take 10 mg by mouth daily.      . vitamin C (ASCORBIC ACID) 500 MG tablet Take 500 mg by mouth daily.       No current facility-administered medications on file prior to visit.   ROS:   Negative except for chronic hearing loss and joint pain  Physical Exam General: well developed, well nourished, seated, in no evident distress Head: head normocephalic and atraumatic. Oropharynx benign Neck: supple with no carotid  bruits Cardiovascular: regular rate and rhythm, no murmurs  Neurologic Exam Mental Status: Awake and fully alert. Oriented to place and time. Follows all  commands. Speech and language normal.   Cranial Nerves: Pupils equal, briskly reactive to light. Extraocular movements full without nystagmus. Visual fields full to confrontation. Hearing intact and symmetric to finger snap. Facial sensation intact. Face, tongue, palate move normally and symmetrically. Neck flexion and extension normal.  Motor: Normal bulk and tone. Normal strength in all tested extremity muscles.No focal weakness. Mildly diminished fine finger movements in the left hand Sensory.: Touch and pinprick are normal except in the medial 2 fingers of the left hand which is decreased.  Coordination: Rapid alternating movements normal in all extremities. Finger-to-nose and heel-to-shin performed accurately bilaterally. Gait and Station: Arises from chair without difficulty. Stance is normal.  Able to heel, toe and tandem walk without difficulty.  Reflexes: 2+ and symmetric. Toes downgoing.     ASSESSMENT: History of left basal ganglia infarct April 2013 due to small vessel disease.  Vascular risk factors of smoking and hypertension only.  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 Continue to cut down on his smoking the pipe.  Patient has retired from his work as a Surveyor, mining. F/U in 6 months Nilda Riggs, [facility] [employer] APRN

## 2012-12-29 DIAGNOSIS — Z23 Encounter for immunization: Secondary | ICD-10-CM | POA: Diagnosis not present

## 2013-01-22 DIAGNOSIS — C61 Malignant neoplasm of prostate: Secondary | ICD-10-CM | POA: Diagnosis not present

## 2013-01-29 DIAGNOSIS — C61 Malignant neoplasm of prostate: Secondary | ICD-10-CM | POA: Diagnosis not present

## 2013-01-29 DIAGNOSIS — Z8551 Personal history of malignant neoplasm of bladder: Secondary | ICD-10-CM | POA: Diagnosis not present

## 2013-01-29 DIAGNOSIS — R972 Elevated prostate specific antigen [PSA]: Secondary | ICD-10-CM | POA: Diagnosis not present

## 2013-01-29 DIAGNOSIS — N529 Male erectile dysfunction, unspecified: Secondary | ICD-10-CM | POA: Diagnosis not present

## 2013-03-27 DIAGNOSIS — N400 Enlarged prostate without lower urinary tract symptoms: Secondary | ICD-10-CM | POA: Diagnosis not present

## 2013-03-27 DIAGNOSIS — C679 Malignant neoplasm of bladder, unspecified: Secondary | ICD-10-CM | POA: Diagnosis not present

## 2013-03-27 DIAGNOSIS — C61 Malignant neoplasm of prostate: Secondary | ICD-10-CM | POA: Diagnosis not present

## 2013-05-09 ENCOUNTER — Other Ambulatory Visit: Payer: Self-pay

## 2013-05-09 MED ORDER — CLOPIDOGREL BISULFATE 75 MG PO TABS: 75.0000 mg | ORAL_TABLET | Freq: Every day | ORAL | Status: DC

## 2013-05-29 ENCOUNTER — Encounter: Payer: Self-pay | Admitting: Neurology

## 2013-05-29 ENCOUNTER — Encounter (INDEPENDENT_AMBULATORY_CARE_PROVIDER_SITE_OTHER): Payer: Self-pay

## 2013-05-29 ENCOUNTER — Ambulatory Visit (INDEPENDENT_AMBULATORY_CARE_PROVIDER_SITE_OTHER): Payer: Medicare Other | Admitting: Neurology

## 2013-05-29 VITALS — BP 126/66 | HR 59 | Ht 70.0 in | Wt 143.6 lb

## 2013-05-29 DIAGNOSIS — I6350 Cerebral infarction due to unspecified occlusion or stenosis of unspecified cerebral artery: Secondary | ICD-10-CM | POA: Diagnosis not present

## 2013-05-29 NOTE — Patient Instructions (Signed)
I had a long discussion the patient and wife regarding his stroke risk factors and second stroke prevention and answered questions. Discontinue aspirin and continue Plavix alone for secondary prevention as I do not see any benefit of dual antiplatelet therapy in the long run. Maintain strict control of hypertension with blood pressure goal below 130/90 and lipids the LDL cholesterol goal below 100 mg percent. Check followup carotid Dopplers. I advised the patient to quit smoking cigars completely. Return for followup in one year call earlier if necessary

## 2013-05-29 NOTE — Progress Notes (Signed)
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 Vascular risk factors of smoking and hypertension only. 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 He returns for followup after last visit on 10/26/2012. He continues to have residual paresthesias on his left face and left hand. He has retired from  work.He still has proprioceptive difficulties in the left hand but has learned to cope  using his vision to help. He has cut back his smoking of the  pipe but has not quit completely. His blood pressure is under good control. He has no new complaints. He continues to tolerate Plavix well without side effects. He has had no further stroke symptoms. He states his last lipid profile was checked in fall on the 14th and was fine. His blood pressure is fine today at 126/66.    Medications Current Outpatient Prescriptions on File Prior to Visit  Medication Sig Dispense Refill  . clopidogrel (PLAVIX) 75 MG tablet Take 1 tablet (75 mg total) by mouth daily with breakfast.  90 tablet  0  . Multiple Vitamin (MULITIVITAMIN WITH MINERALS) TABS Take 1 tablet by mouth daily.      . vitamin C (ASCORBIC ACID) 500 MG tablet Take 500 mg by mouth daily.       No current facility-administered medications on file prior to visit.   ROS:   Negative except for chronic hearing loss  back pain, ringing in the ears and numbness and all other systems negative  Physical Exam General: well developed, well nourished elderly Caucasian male, seated, in no evident distress Head: head normocephalic and atraumatic. Oropharynx benign Neck: supple with no carotid  bruits Cardiovascular: regular rate and rhythm, no murmurs  Neurologic Exam Mental Status: Awake and fully alert. Oriented to place and time. Follows all commands. Speech and language normal.    Cranial Nerves: Pupils equal, briskly reactive to light. Extraocular movements full without nystagmus. Visual fields full to confrontation. Hearing intact and symmetric to finger snap. Facial sensation intact. Face, tongue, palate move normally and symmetrically. Neck flexion and extension normal.  Motor: Normal bulk and tone. Normal strength in all tested extremity muscles.No focal weakness. Mildly diminished fine finger movements in the left hand Sensory.: Touch and pinprick are normal except in the medial 2 fingers of the left hand which is decreased.  Coordination: Rapid alternating movements normal in all extremities. Finger-to-nose and heel-to-shin performed accurately bilaterally. Gait and Station: Arises from chair without difficulty. Stance is normal.  Able to heel, toe and tandem walk without difficulty.  Reflexes: 2+ and symmetric. Toes downgoing.     ASSESSMENT: History of left basal ganglia infarct April 2013 due to small vessel disease.  Vascular risk factors of smoking and hypertension only.  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 Discontinue aspirin and continue Plavix alone for secondary prevention as I do not see any benefit of dual antiplatelet therapy in the long run. Maintain strict control of hypertension with blood pressure goal below 130/90 and lipids the LDL cholesterol goal below 100 mg percent. Check followup carotid Dopplers. I advised the patient to quit smoking cigars completely. Return for followup in one  year call earlier if necessary

## 2013-06-06 ENCOUNTER — Ambulatory Visit (INDEPENDENT_AMBULATORY_CARE_PROVIDER_SITE_OTHER): Payer: Medicare Other

## 2013-06-06 DIAGNOSIS — I6350 Cerebral infarction due to unspecified occlusion or stenosis of unspecified cerebral artery: Secondary | ICD-10-CM

## 2013-06-25 ENCOUNTER — Telehealth: Payer: Self-pay | Admitting: *Deleted

## 2013-06-25 NOTE — Telephone Encounter (Signed)
Carotid results [phone].

## 2013-07-30 DIAGNOSIS — C61 Malignant neoplasm of prostate: Secondary | ICD-10-CM | POA: Diagnosis not present

## 2013-08-13 ENCOUNTER — Other Ambulatory Visit: Payer: Self-pay

## 2013-08-13 MED ORDER — CLOPIDOGREL BISULFATE 75 MG PO TABS: 75.0000 mg | ORAL_TABLET | Freq: Every day | ORAL | Status: DC

## 2013-08-15 DIAGNOSIS — N529 Male erectile dysfunction, unspecified: Secondary | ICD-10-CM | POA: Diagnosis not present

## 2013-08-15 DIAGNOSIS — Z8551 Personal history of malignant neoplasm of bladder: Secondary | ICD-10-CM | POA: Diagnosis not present

## 2013-08-15 DIAGNOSIS — R972 Elevated prostate specific antigen [PSA]: Secondary | ICD-10-CM | POA: Diagnosis not present

## 2013-08-15 DIAGNOSIS — C61 Malignant neoplasm of prostate: Secondary | ICD-10-CM | POA: Diagnosis not present

## 2013-10-03 DIAGNOSIS — H35369 Drusen (degenerative) of macula, unspecified eye: Secondary | ICD-10-CM | POA: Diagnosis not present

## 2013-10-03 DIAGNOSIS — H524 Presbyopia: Secondary | ICD-10-CM | POA: Diagnosis not present

## 2013-10-03 DIAGNOSIS — H52229 Regular astigmatism, unspecified eye: Secondary | ICD-10-CM | POA: Diagnosis not present

## 2013-10-03 DIAGNOSIS — H5200 Hypermetropia, unspecified eye: Secondary | ICD-10-CM | POA: Diagnosis not present

## 2013-10-03 DIAGNOSIS — H2510 Age-related nuclear cataract, unspecified eye: Secondary | ICD-10-CM | POA: Diagnosis not present

## 2013-10-03 DIAGNOSIS — H25019 Cortical age-related cataract, unspecified eye: Secondary | ICD-10-CM | POA: Diagnosis not present

## 2013-11-02 DIAGNOSIS — R809 Proteinuria, unspecified: Secondary | ICD-10-CM | POA: Diagnosis not present

## 2013-11-02 DIAGNOSIS — R7309 Other abnormal glucose: Secondary | ICD-10-CM | POA: Diagnosis not present

## 2013-11-02 DIAGNOSIS — R82998 Other abnormal findings in urine: Secondary | ICD-10-CM | POA: Diagnosis not present

## 2013-11-02 DIAGNOSIS — I10 Essential (primary) hypertension: Secondary | ICD-10-CM | POA: Diagnosis not present

## 2013-11-15 DIAGNOSIS — Z1331 Encounter for screening for depression: Secondary | ICD-10-CM | POA: Diagnosis not present

## 2013-11-15 DIAGNOSIS — K3189 Other diseases of stomach and duodenum: Secondary | ICD-10-CM | POA: Diagnosis not present

## 2013-11-15 DIAGNOSIS — I6990 Unspecified sequelae of unspecified cerebrovascular disease: Secondary | ICD-10-CM | POA: Diagnosis not present

## 2013-11-15 DIAGNOSIS — Z23 Encounter for immunization: Secondary | ICD-10-CM | POA: Diagnosis not present

## 2013-11-15 DIAGNOSIS — I498 Other specified cardiac arrhythmias: Secondary | ICD-10-CM | POA: Diagnosis not present

## 2013-11-15 DIAGNOSIS — R7309 Other abnormal glucose: Secondary | ICD-10-CM | POA: Diagnosis not present

## 2013-11-15 DIAGNOSIS — L723 Sebaceous cyst: Secondary | ICD-10-CM | POA: Diagnosis not present

## 2013-11-15 DIAGNOSIS — I10 Essential (primary) hypertension: Secondary | ICD-10-CM | POA: Diagnosis not present

## 2013-11-15 DIAGNOSIS — Z0000 Encounter for general adult medical examination without abnormal findings: Secondary | ICD-10-CM | POA: Diagnosis not present

## 2013-11-15 DIAGNOSIS — Z1212 Encounter for screening for malignant neoplasm of rectum: Secondary | ICD-10-CM | POA: Diagnosis not present

## 2013-11-15 DIAGNOSIS — R972 Elevated prostate specific antigen [PSA]: Secondary | ICD-10-CM | POA: Diagnosis not present

## 2013-11-16 ENCOUNTER — Encounter: Payer: Self-pay | Admitting: *Deleted

## 2013-11-16 NOTE — Telephone Encounter (Signed)
I mailed copy of Carotid doppler results to pts home address.

## 2014-01-01 DIAGNOSIS — Z23 Encounter for immunization: Secondary | ICD-10-CM | POA: Diagnosis not present

## 2014-01-14 DIAGNOSIS — R634 Abnormal weight loss: Secondary | ICD-10-CM | POA: Diagnosis not present

## 2014-01-14 DIAGNOSIS — R1013 Epigastric pain: Secondary | ICD-10-CM | POA: Diagnosis not present

## 2014-01-17 ENCOUNTER — Other Ambulatory Visit: Payer: Self-pay | Admitting: Gastroenterology

## 2014-01-17 DIAGNOSIS — K449 Diaphragmatic hernia without obstruction or gangrene: Secondary | ICD-10-CM | POA: Diagnosis not present

## 2014-01-17 DIAGNOSIS — R634 Abnormal weight loss: Secondary | ICD-10-CM | POA: Diagnosis not present

## 2014-01-17 DIAGNOSIS — K219 Gastro-esophageal reflux disease without esophagitis: Secondary | ICD-10-CM | POA: Diagnosis not present

## 2014-01-17 DIAGNOSIS — K210 Gastro-esophageal reflux disease with esophagitis: Secondary | ICD-10-CM | POA: Diagnosis not present

## 2014-01-17 DIAGNOSIS — K2270 Barrett's esophagus without dysplasia: Secondary | ICD-10-CM | POA: Diagnosis not present

## 2014-01-17 DIAGNOSIS — R1084 Generalized abdominal pain: Secondary | ICD-10-CM

## 2014-01-17 DIAGNOSIS — R1013 Epigastric pain: Secondary | ICD-10-CM | POA: Diagnosis not present

## 2014-01-21 ENCOUNTER — Ambulatory Visit
Admission: RE | Admit: 2014-01-21 | Discharge: 2014-01-21 | Disposition: A | Discharge status: Home / Self Care | Payer: Medicare Other | Source: Ambulatory Visit | Attending: Gastroenterology | Admitting: Gastroenterology

## 2014-01-21 DIAGNOSIS — R1084 Generalized abdominal pain: Secondary | ICD-10-CM | POA: Diagnosis not present

## 2014-01-21 DIAGNOSIS — Z8551 Personal history of malignant neoplasm of bladder: Secondary | ICD-10-CM | POA: Diagnosis not present

## 2014-01-28 DIAGNOSIS — C61 Malignant neoplasm of prostate: Secondary | ICD-10-CM | POA: Diagnosis not present

## 2014-02-05 ENCOUNTER — Telehealth: Payer: Self-pay | Admitting: Neurology

## 2014-02-05 DIAGNOSIS — R972 Elevated prostate specific antigen [PSA]: Secondary | ICD-10-CM | POA: Diagnosis not present

## 2014-02-05 DIAGNOSIS — Z8551 Personal history of malignant neoplasm of bladder: Secondary | ICD-10-CM | POA: Diagnosis not present

## 2014-02-05 DIAGNOSIS — N401 Enlarged prostate with lower urinary tract symptoms: Secondary | ICD-10-CM | POA: Diagnosis not present

## 2014-02-05 DIAGNOSIS — C61 Malignant neoplasm of prostate: Secondary | ICD-10-CM | POA: Diagnosis not present

## 2014-02-05 DIAGNOSIS — R351 Nocturia: Secondary | ICD-10-CM | POA: Diagnosis not present

## 2014-02-05 NOTE — Telephone Encounter (Signed)
Okay to do so with a small but acceptable periprocedural risk for stroke/TIA and to resume antiplatelet therapy after the procedure when safe to do so.

## 2014-02-05 NOTE — Telephone Encounter (Signed)
Clearance needed for Asprin and Plavix to be stopped for biopsy, please advise.

## 2014-02-05 NOTE — Telephone Encounter (Signed)
Hassan Rowan from Dr. Ralene Muskrat office calling to get clearance for patient to stop Plavix for 7 days and Aspirin for 5 days for prostate biopsy, please return call and advise, clearance can be faxed to [phone].

## 2014-02-06 ENCOUNTER — Encounter: Payer: Self-pay | Admitting: *Deleted

## 2014-02-06 NOTE — Telephone Encounter (Signed)
Letter has be faxed to Dr Ralene Muskrat office.

## 2014-02-27 DIAGNOSIS — R1013 Epigastric pain: Secondary | ICD-10-CM | POA: Diagnosis not present

## 2014-02-27 DIAGNOSIS — R634 Abnormal weight loss: Secondary | ICD-10-CM | POA: Diagnosis not present

## 2014-03-06 ENCOUNTER — Other Ambulatory Visit: Payer: Self-pay | Admitting: Gastroenterology

## 2014-03-06 DIAGNOSIS — R634 Abnormal weight loss: Secondary | ICD-10-CM

## 2014-03-06 DIAGNOSIS — R1084 Generalized abdominal pain: Secondary | ICD-10-CM

## 2014-03-11 ENCOUNTER — Ambulatory Visit
Admission: RE | Admit: 2014-03-11 | Discharge: 2014-03-11 | Disposition: A | Discharge status: Home / Self Care | Payer: Medicare Other | Source: Ambulatory Visit | Attending: Gastroenterology | Admitting: Gastroenterology

## 2014-03-11 DIAGNOSIS — R634 Abnormal weight loss: Secondary | ICD-10-CM | POA: Diagnosis not present

## 2014-03-11 DIAGNOSIS — K5710 Diverticulosis of small intestine without perforation or abscess without bleeding: Secondary | ICD-10-CM | POA: Diagnosis not present

## 2014-03-11 DIAGNOSIS — K5900 Constipation, unspecified: Secondary | ICD-10-CM | POA: Diagnosis not present

## 2014-03-11 DIAGNOSIS — Z8551 Personal history of malignant neoplasm of bladder: Secondary | ICD-10-CM | POA: Diagnosis not present

## 2014-03-11 DIAGNOSIS — R1084 Generalized abdominal pain: Secondary | ICD-10-CM

## 2014-03-11 MED ORDER — IOHEXOL 300 MG/ML  SOLN
100.0000 mL | Freq: Once | INTRAMUSCULAR | Status: AC | PRN
  Administered 2014-03-11: 100 mL via INTRAVENOUS

## 2014-04-02 DIAGNOSIS — I700 Atherosclerosis of aorta: Secondary | ICD-10-CM | POA: Diagnosis not present

## 2014-04-02 DIAGNOSIS — C61 Malignant neoplasm of prostate: Secondary | ICD-10-CM | POA: Diagnosis not present

## 2014-04-02 DIAGNOSIS — I2510 Atherosclerotic heart disease of native coronary artery without angina pectoris: Secondary | ICD-10-CM | POA: Diagnosis not present

## 2014-04-02 DIAGNOSIS — R634 Abnormal weight loss: Secondary | ICD-10-CM | POA: Diagnosis not present

## 2014-04-02 DIAGNOSIS — F17200 Nicotine dependence, unspecified, uncomplicated: Secondary | ICD-10-CM | POA: Diagnosis not present

## 2014-04-02 DIAGNOSIS — Z1389 Encounter for screening for other disorder: Secondary | ICD-10-CM | POA: Diagnosis not present

## 2014-04-02 DIAGNOSIS — I6990 Unspecified sequelae of unspecified cerebrovascular disease: Secondary | ICD-10-CM | POA: Diagnosis not present

## 2014-04-02 DIAGNOSIS — I10 Essential (primary) hypertension: Secondary | ICD-10-CM | POA: Diagnosis not present

## 2014-04-02 DIAGNOSIS — Z6821 Body mass index (BMI) 21.0-21.9, adult: Secondary | ICD-10-CM | POA: Diagnosis not present

## 2014-04-02 DIAGNOSIS — C679 Malignant neoplasm of bladder, unspecified: Secondary | ICD-10-CM | POA: Diagnosis not present

## 2014-06-05 DIAGNOSIS — C61 Malignant neoplasm of prostate: Secondary | ICD-10-CM | POA: Diagnosis not present

## 2014-06-19 ENCOUNTER — Telehealth: Payer: Self-pay | Admitting: Neurology

## 2014-06-19 NOTE — Telephone Encounter (Signed)
FYI

## 2014-06-19 NOTE — Telephone Encounter (Signed)
FYI - Patient wanted to inform Dr. Leonie Man he's doing great, and feels a 1 yr fu isn't necessary and would like to return on PRN basis.

## 2014-06-24 NOTE — Telephone Encounter (Signed)
Agree 

## 2014-08-02 ENCOUNTER — Telehealth: Payer: Self-pay

## 2014-08-02 MED ORDER — CLOPIDOGREL BISULFATE 75 MG PO TABS: 75.0000 mg | ORAL_TABLET | Freq: Every day | ORAL | Status: AC

## 2014-08-02 NOTE — Telephone Encounter (Signed)
[facility] is requesting a refill on Plavix.  Patient has not been seen in over 1 year, last note says patient does not wish to be seen yearly, only PRN.  Okay to refill med or should patient refer to PCP?  Please advise.  Thank you.

## 2014-08-02 NOTE — Telephone Encounter (Signed)
Ok to refill 

## 2014-08-20 DIAGNOSIS — H35362 Drusen (degenerative) of macula, left eye: Secondary | ICD-10-CM | POA: Diagnosis not present

## 2014-08-20 DIAGNOSIS — H2513 Age-related nuclear cataract, bilateral: Secondary | ICD-10-CM | POA: Diagnosis not present

## 2014-08-20 DIAGNOSIS — H52223 Regular astigmatism, bilateral: Secondary | ICD-10-CM | POA: Diagnosis not present

## 2014-08-20 DIAGNOSIS — H5203 Hypermetropia, bilateral: Secondary | ICD-10-CM | POA: Diagnosis not present

## 2014-08-20 DIAGNOSIS — H25033 Anterior subcapsular polar age-related cataract, bilateral: Secondary | ICD-10-CM | POA: Diagnosis not present

## 2014-08-20 DIAGNOSIS — H524 Presbyopia: Secondary | ICD-10-CM | POA: Diagnosis not present

## 2014-11-15 DIAGNOSIS — I2510 Atherosclerotic heart disease of native coronary artery without angina pectoris: Secondary | ICD-10-CM | POA: Diagnosis not present

## 2014-11-18 DIAGNOSIS — R634 Abnormal weight loss: Secondary | ICD-10-CM | POA: Diagnosis not present

## 2014-11-18 DIAGNOSIS — C679 Malignant neoplasm of bladder, unspecified: Secondary | ICD-10-CM | POA: Diagnosis not present

## 2014-11-18 DIAGNOSIS — D649 Anemia, unspecified: Secondary | ICD-10-CM | POA: Diagnosis not present

## 2014-11-18 DIAGNOSIS — C61 Malignant neoplasm of prostate: Secondary | ICD-10-CM | POA: Diagnosis not present

## 2014-11-18 DIAGNOSIS — I10 Essential (primary) hypertension: Secondary | ICD-10-CM | POA: Diagnosis not present

## 2014-11-18 DIAGNOSIS — R5383 Other fatigue: Secondary | ICD-10-CM | POA: Diagnosis not present

## 2014-11-22 DIAGNOSIS — I10 Essential (primary) hypertension: Secondary | ICD-10-CM | POA: Diagnosis not present

## 2014-11-22 DIAGNOSIS — I6990 Unspecified sequelae of unspecified cerebrovascular disease: Secondary | ICD-10-CM | POA: Diagnosis not present

## 2014-11-22 DIAGNOSIS — Z23 Encounter for immunization: Secondary | ICD-10-CM | POA: Diagnosis not present

## 2014-11-22 DIAGNOSIS — I700 Atherosclerosis of aorta: Secondary | ICD-10-CM | POA: Diagnosis not present

## 2014-11-22 DIAGNOSIS — K30 Functional dyspepsia: Secondary | ICD-10-CM | POA: Diagnosis not present

## 2014-11-22 DIAGNOSIS — C679 Malignant neoplasm of bladder, unspecified: Secondary | ICD-10-CM | POA: Diagnosis not present

## 2014-11-22 DIAGNOSIS — Z6821 Body mass index (BMI) 21.0-21.9, adult: Secondary | ICD-10-CM | POA: Diagnosis not present

## 2014-11-22 DIAGNOSIS — M1990 Unspecified osteoarthritis, unspecified site: Secondary | ICD-10-CM | POA: Diagnosis not present

## 2014-11-22 DIAGNOSIS — Z0000 Encounter for general adult medical examination without abnormal findings: Secondary | ICD-10-CM | POA: Diagnosis not present

## 2014-11-22 DIAGNOSIS — I2510 Atherosclerotic heart disease of native coronary artery without angina pectoris: Secondary | ICD-10-CM | POA: Diagnosis not present

## 2014-11-22 DIAGNOSIS — R7301 Impaired fasting glucose: Secondary | ICD-10-CM | POA: Diagnosis not present

## 2014-11-22 DIAGNOSIS — C61 Malignant neoplasm of prostate: Secondary | ICD-10-CM | POA: Diagnosis not present

## 2014-11-26 DIAGNOSIS — Z1212 Encounter for screening for malignant neoplasm of rectum: Secondary | ICD-10-CM | POA: Diagnosis not present

## 2014-11-27 DIAGNOSIS — I10 Essential (primary) hypertension: Secondary | ICD-10-CM | POA: Diagnosis not present

## 2014-11-27 DIAGNOSIS — Z6822 Body mass index (BMI) 22.0-22.9, adult: Secondary | ICD-10-CM | POA: Diagnosis not present

## 2014-11-27 DIAGNOSIS — T783XXA Angioneurotic edema, initial encounter: Secondary | ICD-10-CM | POA: Diagnosis not present

## 2015-01-01 IMAGING — CT CT ABD-PELV W/ CM
2 of 5 series · 16 of 46 positions shown, 18 images · IV contrast (READICAT/WATER & [ID] OMNI 300)
Comparison: CT of the abdomen and pelvis 03/27/2013.

CLINICAL DATA: 73-year-old male with history of bladder and
prostate cancer status post BCG therapy for bladder cancer. Recent
endoscopy demonstrated gastric inflammation, improved after therapy.
Epigastric pain for the past year, with 10 pound weight loss and
constipation.

EXAM:
CT ABDOMEN AND PELVIS WITH CONTRAST
TECHNIQUE: Multidetector CT imaging of the abdomen and pelvis was performed
using the standard protocol following bolus administration of
intravenous contrast.
CONTRAST:  100mL OMNIPAQUE IOHEXOL 300 MG/ML  SOLN

[Series 2: abd/pelvis with · axial · 0.70mm/px · z∈[-447,-67]mm · 13 of 86 slices shown, 15 images]
[im 5/86  soft-tissue]
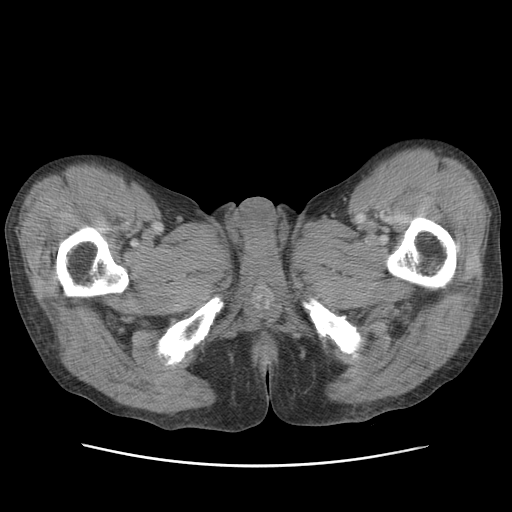
[im 5/86  bone]
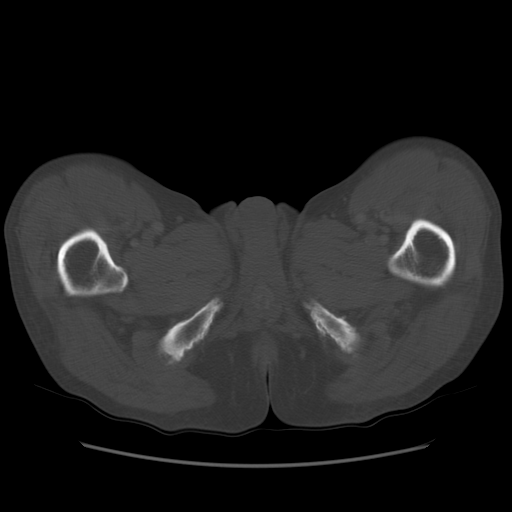
[im 10/86  soft-tissue]
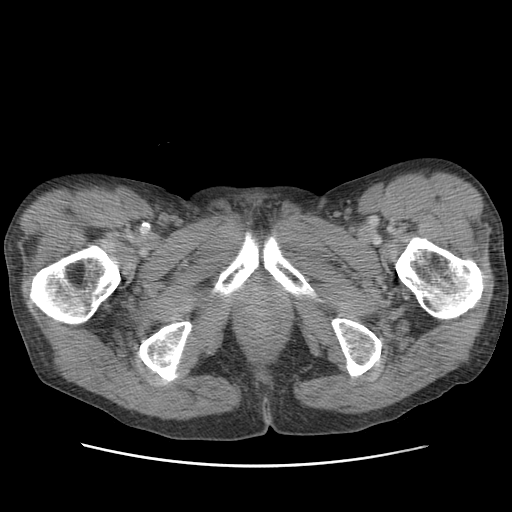
[im 19/86  soft-tissue]
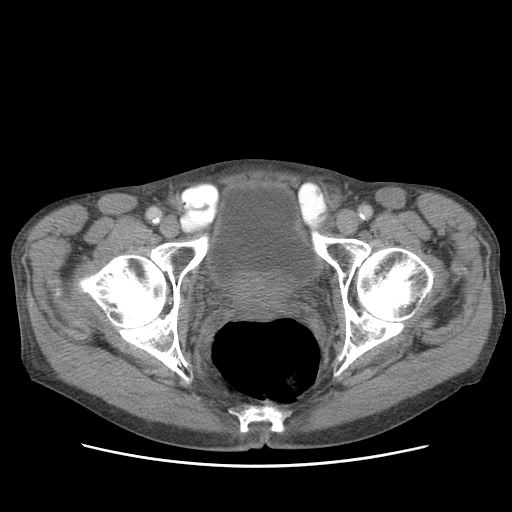
[im 24/86  soft-tissue]
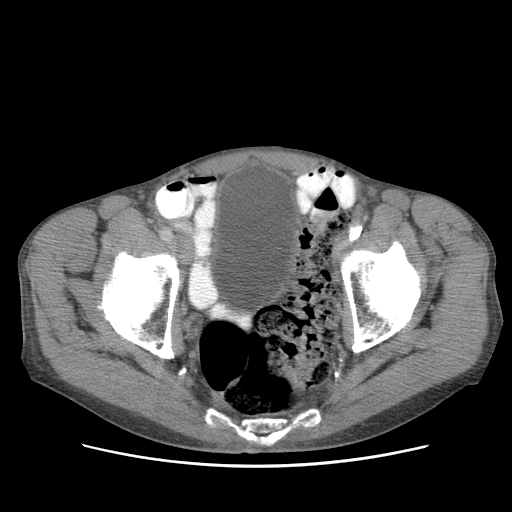
[im 29/86  soft-tissue]
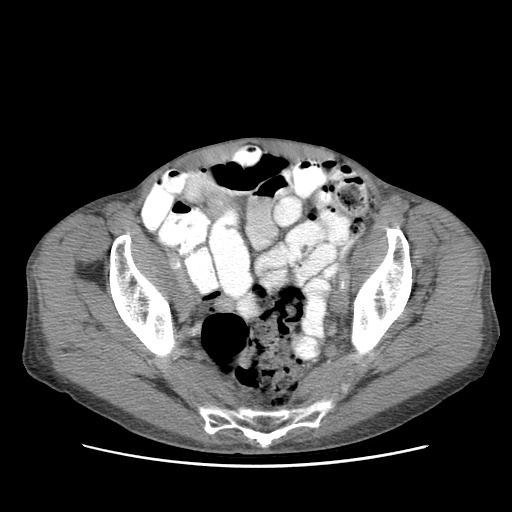
[im 38/86  soft-tissue]
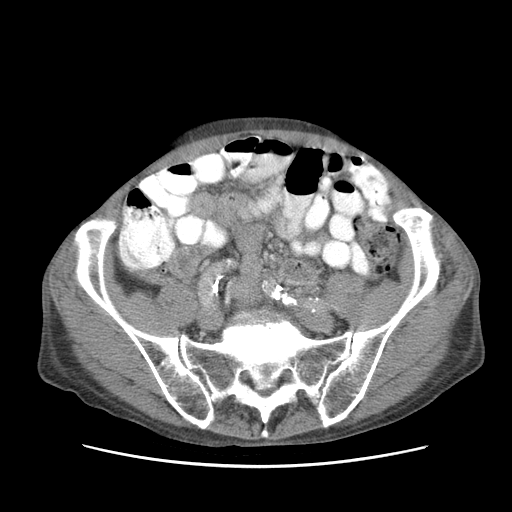
[im 43/86  soft-tissue]
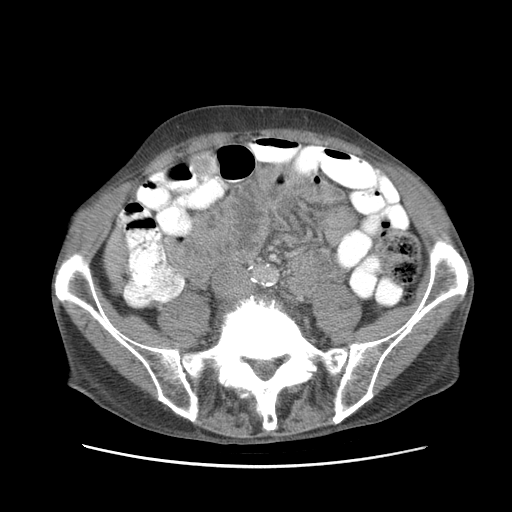
[im 48/86  soft-tissue]
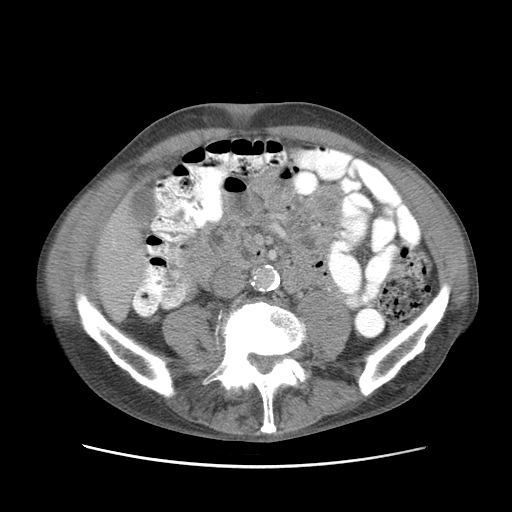
[im 57/86  soft-tissue]
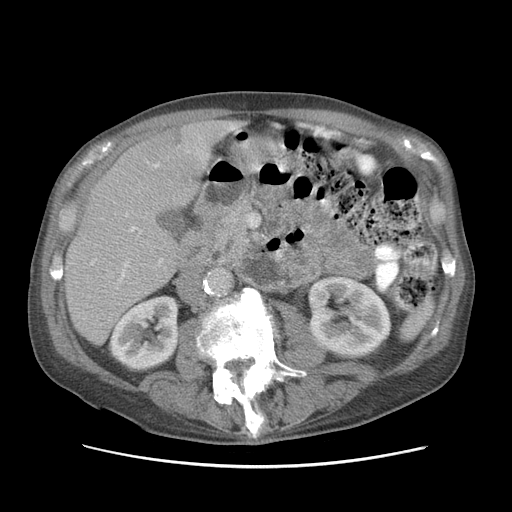
[im 57/86  bone]
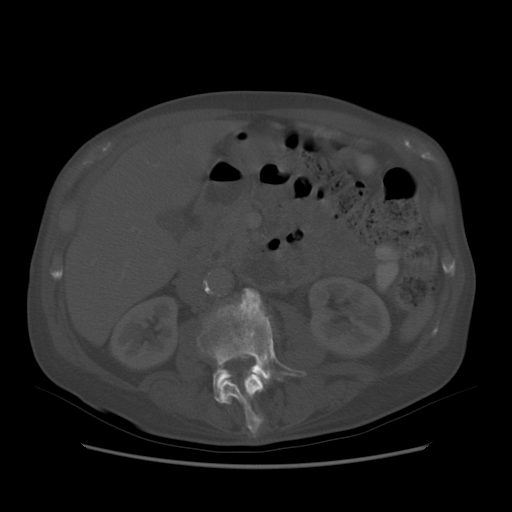
[im 62/86  soft-tissue]
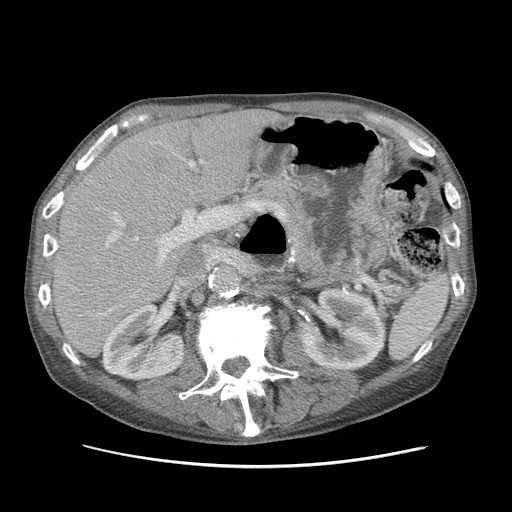
[im 67/86  soft-tissue]
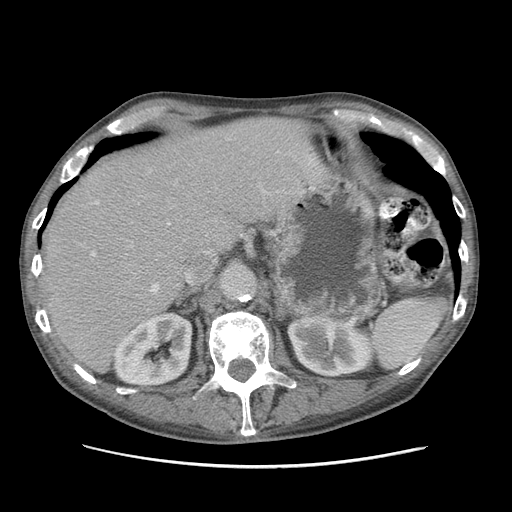
[im 76/86  soft-tissue]
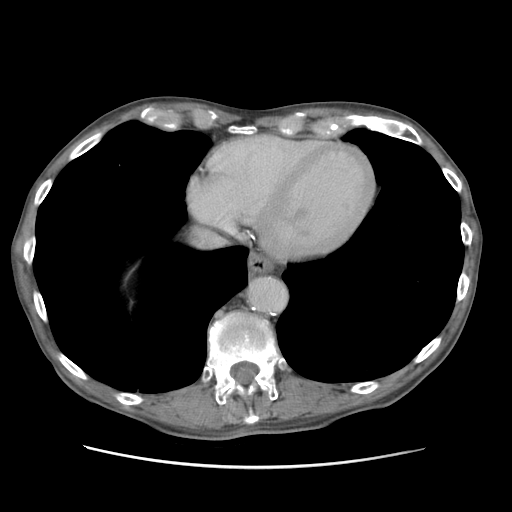
[im 81/86  soft-tissue]
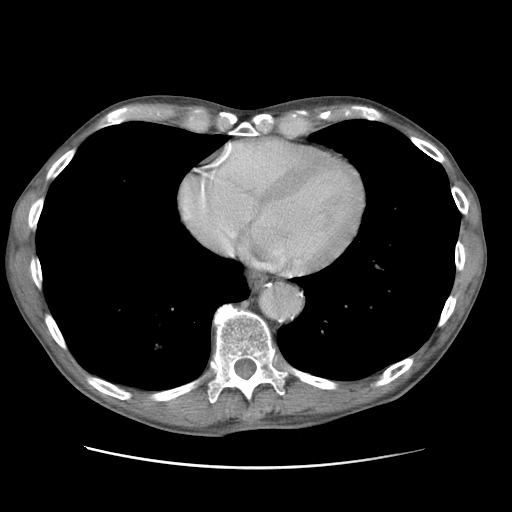

[Series 400: cor · coronal · 0.86mm/px · 3 of 126 slices shown]
[im 42/126  soft-tissue]
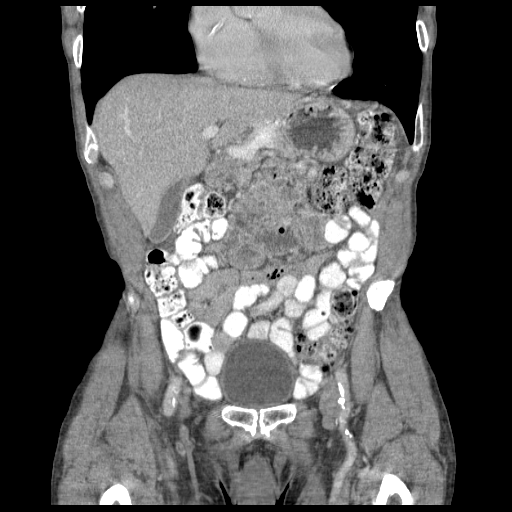
[im 56/126  soft-tissue]
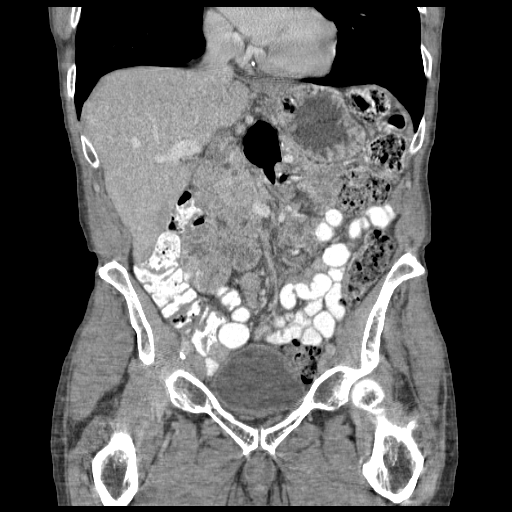
[im 70/126  soft-tissue]
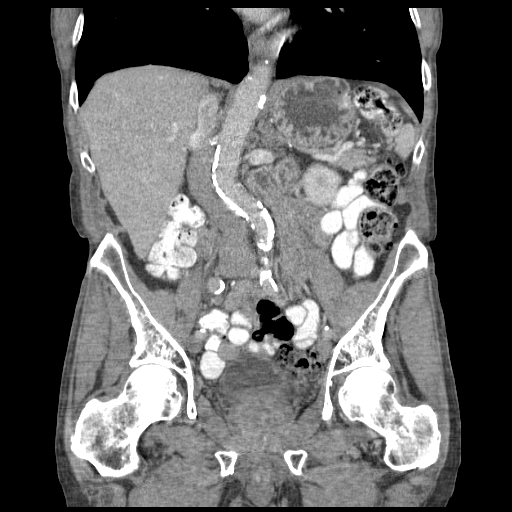

[16 of 46 positions shown; findings below may reference images not displayed]

FINDINGS: Lower chest: Atherosclerotic calcifications in the left circumflex
and right coronary artery territories.

Hepatobiliary: Ill-defined area of low attenuation in segment for of
the liver (predominantly segment 4B) is likely to represent a
combination of focal fatty infiltration and perfusion anomaly; this
is technically indeterminate, but is unchanged compared to prior
study 03/27/2013. No other suspicious appearing cystic or solid
hepatic lesions. No intra or extrahepatic biliary ductal dilatation.
Gallbladder is unremarkable in appearance.

Pancreas: Unremarkable.

Spleen: Unremarkable.

Adrenals/Urinary Tract: Sub cm low-attenuation lesion in the
posterior aspect of the upper pole of the left kidney, too small to
characterize, but statistically likely a tiny cyst. The right kidney
and bilateral adrenal glands are normal in appearance. No
hydroureteronephrosis. Urinary bladder is normal in appearance.

Stomach/Bowel: Normal appearance of the stomach specifically, no
definite gastric mass identified. No pathologic dilatation of small
bowel or colon. 3.7 x 4.1 cm diverticulum extending off the superior
aspect of the third portion of the duodenum incidentally noted.

Vascular/Lymphatic: Atherosclerosis throughout the abdominal and
pelvic vasculature, without evidence of aneurysm or dissection. No
lymphadenopathy noted in the abdomen or pelvis.

Reproductive: Prostate gland and seminal vesicles are unremarkable
in appearance.

Other: No significant volume of ascites.  No pneumoperitoneum.

Musculoskeletal: Severe multilevel degenerative disc disease
throughout the lumbar spine, with complete bony fusion of L3 and L4.
Multilevel facet arthropathy. There are no aggressive appearing
lytic or blastic lesions noted in the visualized portions of the
skeleton.
IMPRESSION: 1. No acute findings in the abdomen or pelvis to account for the
patient's symptoms.
2. Ill-defined area of low attenuation in segment 4 of the liver,
strongly favored to represent a combination of focal fatty
infiltration and a perfusion anomaly, particularly given its
similarity to prior study 03/27/2013.
3. No definite signs of metastatic disease in the abdomen or pelvis.
4. Large duodenal diverticulum from the third portion of the
duodenum again noted.
5. Extensive atherosclerosis, including at least 2 vessel coronary
artery disease. Assessment for potential risk factor modification,
dietary therapy or pharmacologic therapy may be warranted, if
clinically indicated.

## 2015-01-20 DIAGNOSIS — C61 Malignant neoplasm of prostate: Secondary | ICD-10-CM | POA: Diagnosis not present

## 2015-01-27 DIAGNOSIS — C61 Malignant neoplasm of prostate: Secondary | ICD-10-CM | POA: Diagnosis not present

## 2015-01-27 DIAGNOSIS — R972 Elevated prostate specific antigen [PSA]: Secondary | ICD-10-CM | POA: Diagnosis not present

## 2015-01-27 DIAGNOSIS — Z8551 Personal history of malignant neoplasm of bladder: Secondary | ICD-10-CM | POA: Diagnosis not present

## 2015-02-04 ENCOUNTER — Other Ambulatory Visit: Payer: Self-pay | Admitting: Urology

## 2015-02-11 ENCOUNTER — Encounter (HOSPITAL_BASED_OUTPATIENT_CLINIC_OR_DEPARTMENT_OTHER): Payer: Self-pay | Admitting: *Deleted

## 2015-02-11 NOTE — Progress Notes (Signed)
NPO AFTER MN.  ARRIVE AT 0715.  NEEDS ISTAT AND EKG.  WILL TAKE AM MEDS DOS W/ EXCEPTION NO HCTZ, W/ SIPS OF WATER.

## 2015-02-17 NOTE — H&P (Signed)
Active Problems Problems  1. Benign prostatic hyperplasia with urinary obstruction (N40.1,N13.8) 2. Bladder cancer (C67.9) 3. Elevated prostate specific antigen (PSA) (R97.20) 4. Erectile dysfunction due to arterial insufficiency (N52.01) 5. Increased urinary frequency (R35.0) 6. Personal history of bladder cancer (Z85.51) 7. Prostate cancer (C61)  History of Present Illness Philip Ward returns today in f/u for surveillance cystoscopy and f/u for his history of prostate cancer. He was found to have an elevated PSA of 5.02 in 2013 and had right prostate induration and had a biopsy on 09/29/11.  He was found on path to have 2 cores in the left apical prostate with <5% Gleason 6 disease.  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 He had a repeat biopsy in 3/16 that showed 2 cores of Gleason 6 disease with 30% DI in the left medial apical core and 10% in the left lateral apical core.  His PSA prior to this visit is back up to 6.02 and the previous high was 5.61 but it has been up and down in the interim. He has a history of a HG NMIBC resected in 1/12 and received induction BCG with maintenance. He had a negative cytology in May 2015 and cystoscopy in 11/15.  He has had no hematuria or dysuria and is voiding without urgency. He has stable nocturia x 1-2. He has a clear urine today.  He had a negative CT in January of this year.  He has ED but is not on treatment at this time.   Past Medical History Problems  1. History of Arthritis 2. History of Eye Trauma 3. History of anemia (Z86.2) 4. History of esophageal reflux (Z87.19) 5. History of Ischemic Stroke 6. History of Left Ventricular Hypertrophy 7. Personal history of bladder cancer (Z85.51)  Surgical History Problems  1. History of Cystoscopy With Biopsy 2. History of Cystoscopy With Fulguration Large Lesion (Over 5cm) 3. History of Needle Biopsy Of Prostate 4. History of Upper Gastrointestinal Endoscopy, Simple  Primary Exam  Current Meds 1. Aleve TABS;  Therapy: (Recorded:27May2015) to Recorded 2. AmLODIPine Besylate 10 MG Oral Tablet;  Therapy: (Recorded:07Nov2016) to Recorded 3. Aspirin 81 MG TABS;  Therapy: (Recorded:03Apr2014) to Recorded 4. Clopidogrel Bisulfate 75 MG Oral Tablet;  Therapy: 26Aug2013 to Recorded 5. Fenofibrate 160 MG Oral Tablet;  Therapy: (Recorded:07Nov2016) to Recorded 6. Hydrochlorothiazide 12.5 MG Oral Capsule;  Therapy: (Recorded:07Nov2016) to Recorded 7. Multi-Vitamin TABS;  Therapy: (Recorded:29Dec2011) to Recorded 8. Protonix 40 MG Oral Packet;  Therapy: (Recorded:17Nov2015) to Recorded 9. Vitamin C TABS;  Therapy: (Recorded:29Dec2011) to Recorded  Allergies Medication  1. lisinopril 2. Statins  Family History Problems  1. Family history of Chronic Obstructive Pulmonary Disease : Father 2. Family history of Death In The Family Father   age 25 of COPD 65. Family history of Death In The Family Mother   age 82 of heart attack 4. Family history of Family Health Status Number Of Children   1 daughter 5. Family history of Heart Disease : Mother 6. Family history of Heart Disease : Brother 7. Family history of Kidney Cancer : Paternal Grandfather 8. Family history of Prostate Cancer : Maternal Grandfather  Social History Problems  1. Alcohol Use (History)   2-3 a day 2. Caffeine Use   3 a day 3. Current Some Day Smoker 4. Former smoker [phone])   almost off of his pipe. 5. Marital History - Currently Married 6. Occupation:   Librarian, academic 7. Retired  Past and social history reviewed and updated.  Review of Systems Genitourinary, constitutional, skin, eye, otolaryngeal, hematologic/lymphatic, cardiovascular, pulmonary, endocrine, musculoskeletal, gastrointestinal, neurological and psychiatric system(s) were reviewed and pertinent findings if present are noted and are otherwise negative.  Genitourinary: nocturia, but no  hematuria.  Cardiovascular: no chest pain.  Respiratory: no shortness of breath.    Vitals Vital Signs [Data Includes: Last 1 Day]  Recorded: [id number] 08:27AM  Weight: 150 lb  BMI Calculated: 20.34 BSA Calculated: 1.89 Blood Pressure: 154 / 88 Temperature: 97.1 F Heart Rate: 78  Results/Data Urine [Data Includes: Last 1 Day]   [id number]  COLOR YELLOW   APPEARANCE CLEAR   SPECIFIC GRAVITY 1.015   pH 5.5   GLUCOSE NEGATIVE   BILIRUBIN NEGATIVE   KETONE NEGATIVE   BLOOD NEGATIVE   PROTEIN NEGATIVE   NITRITE NEGATIVE   LEUKOCYTE ESTERASE 1+   SQUAMOUS EPITHELIAL/HPF NONE SEEN HPF  WBC 0-5 WBC/HPF  RBC NONE SEEN RBC/HPF  BACTERIA NONE SEEN HPF  CRYSTALS See Below HPF  CASTS NONE SEEN LPF  Yeast NONE SEEN HPF   The following clinical lab reports were reviewed:  UA and PSA reviewed. PSA Flowsheet Philip Ward Includes: Last 3 Years]   Goal 31Oct2016 [id number]  Free PSA         % Free PSA         PSA  6.02 ng/mL 5.04 ng/mL 5.16 ng/mL 4.26 ng/mL 4.14 ng/mL 5.61 ng/mL  Procedure  Procedure: Cystoscopy   Indication: History of Urothelial Carcinoma.  Informed Consent: Risks, benefits, and potential adverse events were discussed and informed consent was obtained from the patient.  Prep: The patient was prepped with betadine.  Procedure Note:  Urethral meatus:. No abnormalities.  Anterior urethra: No abnormalities.  Prostatic urethra: No abnormalities . Estimated length was 4 cm. There was visual obstruction of the prostatic urethra. The lateral prostatic lobes were enlarged. No intravesical median lobe was visualized.  Bladder: Visulization was clear. The ureteral orifices were in the normal anatomic position bilaterally and had clear efflux of urine. A systematic survey of the bladder demonstrated no bladder tumors or stones. Examination of the bladder demonstrated moderate trabeculation and a diverticulum located near the dome of the bladder  measuring approximately 1 cm erythematous mucosa (There is a 2-80mm slightly hemorrhagic area on the left anterior-lateral wall that is equivocal in appearance. ). The patient tolerated the procedure well.  Complications: None.    Assessment Assessed  1. Personal history of bladder cancer (Z85.51) 2. Elevated prostate specific antigen (PSA) (R97.20) 3. Prostate cancer (C61)  His PSA is up slightly from his prior level but only minimally from his prior high.   He has a tiny lesion on the left bladder wall but I can't say that it is a recurrence.   Plan Bladder cancer, PMH: Personal history of bladder cancer  1. Cysto; Status:Complete;   [id number] Health Maintenance  2. UA With REFLEX; [Do Not Release]; Status:Resulted - Requires Verification;   [id number] 08:10AM PMH: Personal history of bladder cancer  3. Follow-up Month x 3 Office  Follow-up  Status: Hold For - Appointment,Date of Service   Requested for: [id number] 4. Cysto; Status:Hold For - Appointment,Date of Service; Requested for:07Nov2016;  5. URINE CYTOLOGY; Status:In Progress - Specimen/Data Collected;   [id number]  Urine cytology today and if it is anything but benign, I will set him up for a biopsy.  If the cytology is negative I will have him return in 3 months for repeat cystoscopy.  His PSA is up slightly but only 4/10 above his prior high 3 years ago.  I will check a PSA and exam in 6 months.   Discussion/Summary CC: Dr. Shon Baton.

## 2015-02-18 ENCOUNTER — Ambulatory Visit (HOSPITAL_BASED_OUTPATIENT_CLINIC_OR_DEPARTMENT_OTHER): Payer: Medicare Other | Admitting: Anesthesiology

## 2015-02-18 ENCOUNTER — Ambulatory Visit (HOSPITAL_BASED_OUTPATIENT_CLINIC_OR_DEPARTMENT_OTHER)
Admission: RE | Admit: 2015-02-18 | Discharge: 2015-02-18 | Disposition: A | Discharge status: Home / Self Care | Payer: Medicare Other | Source: Ambulatory Visit | Attending: Urology | Admitting: Urology

## 2015-02-18 ENCOUNTER — Encounter (HOSPITAL_BASED_OUTPATIENT_CLINIC_OR_DEPARTMENT_OTHER): Payer: Self-pay | Admitting: *Deleted

## 2015-02-18 ENCOUNTER — Encounter (HOSPITAL_BASED_OUTPATIENT_CLINIC_OR_DEPARTMENT_OTHER)
Admission: RE | Disposition: A | Discharge status: Home / Self Care | Payer: Self-pay | Source: Ambulatory Visit | Attending: Urology

## 2015-02-18 ENCOUNTER — Other Ambulatory Visit: Payer: Self-pay

## 2015-02-18 DIAGNOSIS — K219 Gastro-esophageal reflux disease without esophagitis: Secondary | ICD-10-CM | POA: Insufficient documentation

## 2015-02-18 DIAGNOSIS — I10 Essential (primary) hypertension: Secondary | ICD-10-CM | POA: Insufficient documentation

## 2015-02-18 DIAGNOSIS — F17290 Nicotine dependence, other tobacco product, uncomplicated: Secondary | ICD-10-CM | POA: Diagnosis not present

## 2015-02-18 DIAGNOSIS — I471 Supraventricular tachycardia: Secondary | ICD-10-CM | POA: Diagnosis not present

## 2015-02-18 DIAGNOSIS — Z7982 Long term (current) use of aspirin: Secondary | ICD-10-CM | POA: Insufficient documentation

## 2015-02-18 DIAGNOSIS — Z7902 Long term (current) use of antithrombotics/antiplatelets: Secondary | ICD-10-CM | POA: Insufficient documentation

## 2015-02-18 DIAGNOSIS — M1990 Unspecified osteoarthritis, unspecified site: Secondary | ICD-10-CM | POA: Insufficient documentation

## 2015-02-18 DIAGNOSIS — Z8546 Personal history of malignant neoplasm of prostate: Secondary | ICD-10-CM | POA: Insufficient documentation

## 2015-02-18 DIAGNOSIS — Z8673 Personal history of transient ischemic attack (TIA), and cerebral infarction without residual deficits: Secondary | ICD-10-CM | POA: Diagnosis not present

## 2015-02-18 DIAGNOSIS — Z888 Allergy status to other drugs, medicaments and biological substances status: Secondary | ICD-10-CM | POA: Diagnosis not present

## 2015-02-18 DIAGNOSIS — N401 Enlarged prostate with lower urinary tract symptoms: Secondary | ICD-10-CM | POA: Insufficient documentation

## 2015-02-18 DIAGNOSIS — Z8551 Personal history of malignant neoplasm of bladder: Secondary | ICD-10-CM | POA: Diagnosis not present

## 2015-02-18 DIAGNOSIS — N3020 Other chronic cystitis without hematuria: Secondary | ICD-10-CM | POA: Diagnosis not present

## 2015-02-18 DIAGNOSIS — N138 Other obstructive and reflux uropathy: Secondary | ICD-10-CM | POA: Diagnosis not present

## 2015-02-18 DIAGNOSIS — N329 Bladder disorder, unspecified: Secondary | ICD-10-CM | POA: Diagnosis not present

## 2015-02-18 DIAGNOSIS — Z79899 Other long term (current) drug therapy: Secondary | ICD-10-CM | POA: Diagnosis not present

## 2015-02-18 DIAGNOSIS — I739 Peripheral vascular disease, unspecified: Secondary | ICD-10-CM | POA: Diagnosis not present

## 2015-02-18 HISTORY — DX: Personal history of colon polyps, unspecified: Z86.0100

## 2015-02-18 HISTORY — DX: Personal history of colonic polyps: Z86.010

## 2015-02-18 HISTORY — DX: Spinal stenosis, lumbar region without neurogenic claudication: M48.061

## 2015-02-18 HISTORY — PX: CYSTOSCOPY WITH BIOPSY: SHX5122

## 2015-02-18 HISTORY — DX: Occlusion and stenosis of unspecified carotid artery: I65.29

## 2015-02-18 HISTORY — DX: Personal history of other diseases of the circulatory system: Z86.79

## 2015-02-18 HISTORY — DX: Personal history of other specified conditions: Z87.898

## 2015-02-18 HISTORY — DX: Atherosclerosis of aorta: I70.0

## 2015-02-18 HISTORY — DX: Unspecified sequelae of cerebral infarction: I69.30

## 2015-02-18 HISTORY — DX: Personal history of malignant neoplasm of bladder: Z85.51

## 2015-02-18 LAB — POCT I-STAT, CHEM 8
BUN: 16 mg/dL (ref 6–20)
Calcium, Ion: 1.17 mmol/L (ref 1.13–1.30)
Chloride: 102 mmol/L (ref 101–111)
Creatinine, Ser: 1.2 mg/dL (ref 0.61–1.24)
Glucose, Bld: 97 mg/dL (ref 65–99)
HEMATOCRIT: 38 % — AB (ref 39.0–52.0)
HEMOGLOBIN: 12.9 g/dL — AB (ref 13.0–17.0)
Potassium: 4.2 mmol/L (ref 3.5–5.1)
SODIUM: 138 mmol/L (ref 135–145)
TCO2: 22 mmol/L (ref 0–100)

## 2015-02-18 SURGERY — CYSTOSCOPY, WITH BIOPSY
Anesthesia: Monitor Anesthesia Care | Site: Bladder

## 2015-02-18 MED ORDER — OXYCODONE HCL 5 MG PO TABS
5.0000 mg | ORAL_TABLET | Freq: Once | ORAL | Status: DC | PRN
  Filled 2015-02-18: qty 1

## 2015-02-18 MED ORDER — FENTANYL CITRATE (PF) 100 MCG/2ML IJ SOLN
INTRAMUSCULAR | Status: DC | PRN
  Administered 2015-02-18 (×2): 50 ug via INTRAVENOUS

## 2015-02-18 MED ORDER — LACTATED RINGERS IV SOLN
INTRAVENOUS | Status: DC
  Administered 2015-02-18 (×2): via INTRAVENOUS
  Filled 2015-02-18: qty 1000

## 2015-02-18 MED ORDER — CIPROFLOXACIN IN D5W 400 MG/200ML IV SOLN
400.0000 mg | INTRAVENOUS | Status: AC
  Administered 2015-02-18: 400 mg via INTRAVENOUS
  Filled 2015-02-18: qty 200

## 2015-02-18 MED ORDER — PROPOFOL 10 MG/ML IV BOLUS
INTRAVENOUS | Status: DC | PRN
  Administered 2015-02-18: 40 mg via INTRAVENOUS
  Administered 2015-02-18: 150 mg via INTRAVENOUS

## 2015-02-18 MED ORDER — ONDANSETRON HCL 4 MG/2ML IJ SOLN
4.0000 mg | Freq: Four times a day (QID) | INTRAMUSCULAR | Status: DC | PRN
  Filled 2015-02-18: qty 2

## 2015-02-18 MED ORDER — PROPOFOL 10 MG/ML IV BOLUS
INTRAVENOUS | Status: AC
  Filled 2015-02-18: qty 20

## 2015-02-18 MED ORDER — ONDANSETRON HCL 4 MG/2ML IJ SOLN
INTRAMUSCULAR | Status: AC
  Filled 2015-02-18: qty 2

## 2015-02-18 MED ORDER — FENTANYL CITRATE (PF) 100 MCG/2ML IJ SOLN
25.0000 ug | INTRAMUSCULAR | Status: DC | PRN
  Filled 2015-02-18: qty 1

## 2015-02-18 MED ORDER — FENTANYL CITRATE (PF) 100 MCG/2ML IJ SOLN
INTRAMUSCULAR | Status: AC
  Filled 2015-02-18: qty 2

## 2015-02-18 MED ORDER — CIPROFLOXACIN IN D5W 400 MG/200ML IV SOLN
INTRAVENOUS | Status: AC
  Filled 2015-02-18: qty 200

## 2015-02-18 MED ORDER — ONDANSETRON HCL 4 MG/2ML IJ SOLN
INTRAMUSCULAR | Status: DC | PRN
  Administered 2015-02-18: 4 mg via INTRAVENOUS

## 2015-02-18 MED ORDER — OXYCODONE HCL 5 MG/5ML PO SOLN
5.0000 mg | Freq: Once | ORAL | Status: DC | PRN
  Filled 2015-02-18: qty 5

## 2015-02-18 MED ORDER — DEXAMETHASONE SODIUM PHOSPHATE 10 MG/ML IJ SOLN
INTRAMUSCULAR | Status: AC
  Filled 2015-02-18: qty 1

## 2015-02-18 MED ORDER — LIDOCAINE HCL (CARDIAC) 20 MG/ML IV SOLN
INTRAVENOUS | Status: DC | PRN
  Administered 2015-02-18: 50 mg via INTRAVENOUS

## 2015-02-18 MED ORDER — LIDOCAINE HCL 2 % EX GEL
CUTANEOUS | Status: DC | PRN
  Administered 2015-02-18: 1 via URETHRAL

## 2015-02-18 MED ORDER — STERILE WATER FOR IRRIGATION IR SOLN
Status: DC | PRN
  Administered 2015-02-18: 3000 mL via INTRAVESICAL

## 2015-02-18 MED ORDER — LIDOCAINE HCL (CARDIAC) 20 MG/ML IV SOLN
INTRAVENOUS | Status: AC
  Filled 2015-02-18: qty 5

## 2015-02-18 MED ORDER — DEXAMETHASONE SODIUM PHOSPHATE 4 MG/ML IJ SOLN
INTRAMUSCULAR | Status: DC | PRN
  Administered 2015-02-18: 10 mg via INTRAVENOUS

## 2015-02-18 SURGICAL SUPPLY — 16 items
BAG DRAIN URO-CYSTO SKYTR STRL (DRAIN) ×3 IMPLANT
BAG DRN UROCATH (DRAIN) ×1
CLOTH BEACON ORANGE TIMEOUT ST (SAFETY) ×3 IMPLANT
ELECT REM PT RETURN 9FT ADLT (ELECTROSURGICAL) ×3
ELECTRODE REM PT RTRN 9FT ADLT (ELECTROSURGICAL) ×1 IMPLANT
GLOVE SURG SS PI 8.0 STRL IVOR (GLOVE) ×3 IMPLANT
GOWN STRL REUS W/ TWL LRG LVL3 (GOWN DISPOSABLE) ×1 IMPLANT
GOWN STRL REUS W/ TWL XL LVL3 (GOWN DISPOSABLE) ×1 IMPLANT
GOWN STRL REUS W/TWL LRG LVL3 (GOWN DISPOSABLE) ×3
GOWN STRL REUS W/TWL XL LVL3 (GOWN DISPOSABLE) ×3
KIT ROOM TURNOVER WOR (KITS) ×3 IMPLANT
MANIFOLD NEPTUNE II (INSTRUMENTS) ×2 IMPLANT
NDL SAFETY ECLIPSE 18X1.5 (NEEDLE) IMPLANT
NEEDLE HYPO 18GX1.5 SHARP (NEEDLE) ×3
PACK CYSTO (CUSTOM PROCEDURE TRAY) ×3 IMPLANT
WATER STERILE IRR 3000ML UROMA (IV SOLUTION) ×3 IMPLANT

## 2015-02-18 NOTE — Brief Op Note (Signed)
02/18/2015  9:49 AM  PATIENT:  Philip Ward  74 y.o. male  PRE-OPERATIVE DIAGNOSIS:  HISTORY OF BLADDER CANCER  POST-OPERATIVE DIAGNOSIS:  HISTORY OF BLADDER CANCER  PROCEDURE:  Procedure(s): CYSTOSCOPY WITH BLADDER BIOPSY (N/A)  SURGEON:  Surgeon(s) and Role:    * Irine Seal, MD - Primary  PHYSICIAN ASSISTANT:   ASSISTANTS: none   ANESTHESIA:   general  EBL:  Total I/O In: 200 [I.V.:200] Out: -   BLOOD ADMINISTERED:none  DRAINS: none   LOCAL MEDICATIONS USED:  LIDOCAINE  and Amount: 10 ml  SPECIMEN:  Source of Specimen:  bladder biopsy  DISPOSITION OF SPECIMEN:  PATHOLOGY  COUNTS:  YES  TOURNIQUET:  * No tourniquets in log *  DICTATION: .Other Dictation: Dictation Number [id number]  PLAN OF CARE: Discharge to home after PACU  PATIENT DISPOSITION:  PACU - hemodynamically stable.   Delay start of Pharmacological VTE agent (>24hrs) due to surgical blood loss or risk of bleeding: not applicable

## 2015-02-18 NOTE — Anesthesia Postprocedure Evaluation (Signed)
Anesthesia Post Note  Patient: Philip Ward  Procedure(s) Performed: Procedure(s) (LRB): CYSTOSCOPY WITH BLADDER BIOPSY (N/A)  Patient location during evaluation: PACU Anesthesia Type: General Level of consciousness: awake and alert and patient cooperative Pain management: pain level controlled Vital Signs Assessment: post-procedure vital signs reviewed and stable Respiratory status: spontaneous breathing and respiratory function stable Cardiovascular status: stable Anesthetic complications: no    Last Vitals:  Filed Vitals:   02/18/15 1025 02/18/15 1030  BP:  158/70  Pulse: 55 53  Temp:    Resp: 13 13    Last Pain: There were no vitals filed for this visit.               [place]

## 2015-02-18 NOTE — Anesthesia Preprocedure Evaluation (Addendum)
Anesthesia Evaluation  Patient identified by MRN, date of birth, ID band Patient awake    Reviewed: Allergy & Precautions, NPO status , Patient's Chart, lab work & pertinent test results  Airway Mallampati: II   Neck ROM: full    Dental   Pulmonary former smoker,    breath sounds clear to auscultation       Cardiovascular hypertension, + Peripheral Vascular Disease  + dysrhythmias Supra Ventricular Tachycardia  Rhythm:regular Rate:Normal     Neuro/Psych CVA    GI/Hepatic   Endo/Other    Renal/GU      Musculoskeletal  (+) Arthritis ,   Abdominal   Peds  Hematology   Anesthesia Other Findings   Reproductive/Obstetrics                            Anesthesia Physical Anesthesia Plan  ASA: III  Anesthesia Plan: General   Post-op Pain Management:    Induction: Intravenous  Airway Management Planned: LMA  Additional Equipment:   Intra-op Plan:   Post-operative Plan:   Informed Consent: I have reviewed the patients History and Physical, chart, labs and discussed the procedure including the risks, benefits and alternatives for the proposed anesthesia with the patient or authorized representative who has indicated his/her understanding and acceptance.     Plan Discussed with: CRNA, Anesthesiologist and Surgeon  Anesthesia Plan Comments:        Anesthesia Quick Evaluation

## 2015-02-18 NOTE — Transfer of Care (Signed)
Immediate Anesthesia Transfer of Care Note  Patient: Philip Ward  Procedure(s) Performed: Procedure(s): CYSTOSCOPY WITH BLADDER BIOPSY (N/A)  Patient Location: PACU  Anesthesia Type:General  Level of Consciousness: awake, alert  and oriented  Airway & Oxygen Therapy: Patient Spontanous Breathing and Patient connected to nasal cannula oxygen  Post-op Assessment: Report given to RN  Post vital signs: Reviewed and stable  Last Vitals:  Filed Vitals:   02/18/15 0711  BP: 156/64  Pulse: 85  Temp: 36.8 C  Resp: 16    Complications: No apparent anesthesia complications

## 2015-02-18 NOTE — Anesthesia Procedure Notes (Signed)
Procedure Name: LMA Insertion Date/Time: 02/18/2015 9:29 AM Performed by: Bethena Roys T Pre-anesthesia Checklist: Patient identified, Emergency Drugs available, Suction available and Patient being monitored Patient Re-evaluated:Patient Re-evaluated prior to inductionOxygen Delivery Method: Circle System Utilized Preoxygenation: Pre-oxygenation with 100% oxygen Intubation Type: IV induction Ventilation: Mask ventilation without difficulty LMA: LMA inserted LMA Size: 5.0 Number of attempts: 1 Airway Equipment and Method: Bite block Placement Confirmation: positive ETCO2 Tube secured with: Tape Dental Injury: Teeth and Oropharynx as per pre-operative assessment

## 2015-02-18 NOTE — Interval H&P Note (Signed)
History and Physical Interval Note:  02/18/2015 7:17 AM  Philip Ward  has presented today for surgery, with the diagnosis of HISTORY OF BLADDER CANCER  The various methods of treatment have been discussed with the patient and family. After consideration of risks, benefits and other options for treatment, the patient has consented to  Procedure(s): CYSTOSCOPY WITH BLADDER BIOPSY (N/A) as a surgical intervention .  The patient's history has been reviewed, patient examined, no change in status, stable for surgery.  I have reviewed the patient's chart and labs.  Questions were answered to the patient's satisfaction.     Philip Ward

## 2015-02-18 NOTE — Discharge Instructions (Addendum)

## 2015-02-19 ENCOUNTER — Encounter (HOSPITAL_BASED_OUTPATIENT_CLINIC_OR_DEPARTMENT_OTHER): Payer: Self-pay | Admitting: Urology

## 2015-02-19 NOTE — Op Note (Signed)
NAME:  TYRANCE, BRANIGAN NO.:  [id number]  MEDICAL RECORD NO.:  [id number]  LOCATION:                               FACILITY:  [facility]  PHYSICIAN:  Marshall Cork. Jeffie Pollock, M.D.    DATE OF BIRTH:  1940-07-15  DATE OF PROCEDURE:  02/18/2015 DATE OF DISCHARGE:  02/18/2015                              OPERATIVE REPORT   PROCEDURE:  Cystoscopy with bladder biopsy and fulguration.  PREOPERATIVE DIAGNOSIS:  History of bladder cancer with bladder wall lesion.  POSTOPERATIVE DIAGNOSIS:  History of bladder cancer with bladder wall lesion.  SURGEON:  Marshall Cork. Jeffie Pollock, M.D.  ANESTHESIA:  General.  SPECIMEN:  Bladder biopsy from anterior bladder wall.  DRAINS:  None.  BLOOD LOSS:  None.  COMPLICATIONS:  None.  INDICATIONS:  Philip Ward is a 74 year old white male with a history of urothelial carcinoma, who was seen in the office for surveillance cystoscopy and was noted to have approximately a 5-mm erythematous lesion on what appeared to be the anterior wall toward the back.  A cytology was obtained, which revealed atypia and as a result, it was felt this lesion needed to be biopsied.  FINDINGS OF PROCEDURE:  He was taken to the operating room, where he was given Cipro.  General anesthetic was induced.  He was placed in lithotomy position.  His perineum and genitalia were prepped with Betadine solution.  He was draped in usual sterile fashion.  Cystoscopy was performed using a 23-French scope and 30- and 70-degree lenses.  Examination revealed a normal urethra.  The external sphincter was intact.  The prostatic urethra was approximately 3 cm in length with bilobar hyperplasia with some obstruction.  Examination of bladder revealed ureteral orifices in the normal anatomic position effluxing clear urine.  He had moderate-to-severe trabeculation with a couple of small diverticula and cellules.  There was a scar on the right lateral wall.  Additional scar on the left lateral wall.  The  lesion I noted on cystoscopy was not readily apparent despite multiple cycles and used the 70-and 30-degree lens.  There was an area of increased vascularity and some erythema at the anterior bladder neck on the right and with this, I felt the biopsy was indicated.  A single cup biopsy was obtained from the right anterior bladder neck. The biopsy site was fulgurated with a Bugbee electrode.  The patient's bladder was drained.  The scope was removed.  The urethra was instilled with 10 mL of 2% lidocaine jelly.  He was taken down from lithotomy position.  His anesthetic was reversed.  He was moved to recovery room in stable condition.  There were no complications.     Marshall Cork. Jeffie Pollock, M.D.     JJW/MEDQ  D:  02/18/2015  T:  02/19/2015  Job:  [id number]

## 2015-03-06 DIAGNOSIS — Z8551 Personal history of malignant neoplasm of bladder: Secondary | ICD-10-CM | POA: Diagnosis not present

## 2015-05-05 DIAGNOSIS — Z8551 Personal history of malignant neoplasm of bladder: Secondary | ICD-10-CM | POA: Diagnosis not present

## 2015-05-05 DIAGNOSIS — Z0000 Encounter for general adult medical examination without abnormal findings: Secondary | ICD-10-CM | POA: Diagnosis not present

## 2015-07-21 DIAGNOSIS — C61 Malignant neoplasm of prostate: Secondary | ICD-10-CM | POA: Diagnosis not present

## 2015-07-30 DIAGNOSIS — R972 Elevated prostate specific antigen [PSA]: Secondary | ICD-10-CM | POA: Diagnosis not present

## 2015-07-30 DIAGNOSIS — Z0000 Encounter for general adult medical examination without abnormal findings: Secondary | ICD-10-CM | POA: Diagnosis not present

## 2015-07-30 DIAGNOSIS — C61 Malignant neoplasm of prostate: Secondary | ICD-10-CM | POA: Diagnosis not present

## 2015-11-19 DIAGNOSIS — Z8551 Personal history of malignant neoplasm of bladder: Secondary | ICD-10-CM | POA: Diagnosis not present

## 2015-11-19 DIAGNOSIS — R3915 Urgency of urination: Secondary | ICD-10-CM | POA: Diagnosis not present

## 2015-12-19 DIAGNOSIS — R7301 Impaired fasting glucose: Secondary | ICD-10-CM | POA: Diagnosis not present

## 2015-12-19 DIAGNOSIS — Z125 Encounter for screening for malignant neoplasm of prostate: Secondary | ICD-10-CM | POA: Diagnosis not present

## 2015-12-19 DIAGNOSIS — I10 Essential (primary) hypertension: Secondary | ICD-10-CM | POA: Diagnosis not present

## 2015-12-19 DIAGNOSIS — R8299 Other abnormal findings in urine: Secondary | ICD-10-CM | POA: Diagnosis not present

## 2015-12-19 DIAGNOSIS — N390 Urinary tract infection, site not specified: Secondary | ICD-10-CM | POA: Diagnosis not present

## 2015-12-26 DIAGNOSIS — I2510 Atherosclerotic heart disease of native coronary artery without angina pectoris: Secondary | ICD-10-CM | POA: Diagnosis not present

## 2015-12-26 DIAGNOSIS — Z1212 Encounter for screening for malignant neoplasm of rectum: Secondary | ICD-10-CM | POA: Diagnosis not present

## 2015-12-26 DIAGNOSIS — R008 Other abnormalities of heart beat: Secondary | ICD-10-CM | POA: Diagnosis not present

## 2015-12-26 DIAGNOSIS — Z23 Encounter for immunization: Secondary | ICD-10-CM | POA: Diagnosis not present

## 2015-12-26 DIAGNOSIS — C679 Malignant neoplasm of bladder, unspecified: Secondary | ICD-10-CM | POA: Diagnosis not present

## 2015-12-26 DIAGNOSIS — I2789 Other specified pulmonary heart diseases: Secondary | ICD-10-CM | POA: Diagnosis not present

## 2015-12-26 DIAGNOSIS — I69998 Other sequelae following unspecified cerebrovascular disease: Secondary | ICD-10-CM | POA: Diagnosis not present

## 2015-12-26 DIAGNOSIS — R7301 Impaired fasting glucose: Secondary | ICD-10-CM | POA: Diagnosis not present

## 2015-12-26 DIAGNOSIS — I708 Atherosclerosis of other arteries: Secondary | ICD-10-CM | POA: Diagnosis not present

## 2015-12-26 DIAGNOSIS — C61 Malignant neoplasm of prostate: Secondary | ICD-10-CM | POA: Diagnosis not present

## 2015-12-26 DIAGNOSIS — Z0000 Encounter for general adult medical examination without abnormal findings: Secondary | ICD-10-CM | POA: Diagnosis not present

## 2015-12-26 DIAGNOSIS — Z1389 Encounter for screening for other disorder: Secondary | ICD-10-CM | POA: Diagnosis not present

## 2016-01-23 ENCOUNTER — Other Ambulatory Visit: Payer: Self-pay | Admitting: Gastroenterology

## 2016-01-23 DIAGNOSIS — Z86010 Personal history of colonic polyps: Secondary | ICD-10-CM

## 2016-02-24 ENCOUNTER — Ambulatory Visit
Admission: RE | Admit: 2016-02-24 | Discharge: 2016-02-24 | Disposition: A | Discharge status: Home / Self Care | Payer: Medicare Other | Source: Ambulatory Visit | Attending: Gastroenterology | Admitting: Gastroenterology

## 2016-02-24 DIAGNOSIS — K5730 Diverticulosis of large intestine without perforation or abscess without bleeding: Secondary | ICD-10-CM | POA: Diagnosis not present

## 2016-02-24 DIAGNOSIS — Z86010 Personal history of colonic polyps: Secondary | ICD-10-CM

## 2016-04-15 DIAGNOSIS — Z125 Encounter for screening for malignant neoplasm of prostate: Secondary | ICD-10-CM | POA: Diagnosis not present

## 2016-04-26 DIAGNOSIS — C61 Malignant neoplasm of prostate: Secondary | ICD-10-CM | POA: Diagnosis not present

## 2016-04-26 DIAGNOSIS — Z8551 Personal history of malignant neoplasm of bladder: Secondary | ICD-10-CM | POA: Diagnosis not present

## 2016-10-20 DIAGNOSIS — C61 Malignant neoplasm of prostate: Secondary | ICD-10-CM | POA: Diagnosis not present

## 2016-11-02 DIAGNOSIS — N401 Enlarged prostate with lower urinary tract symptoms: Secondary | ICD-10-CM | POA: Diagnosis not present

## 2016-11-02 DIAGNOSIS — C61 Malignant neoplasm of prostate: Secondary | ICD-10-CM | POA: Diagnosis not present

## 2016-11-02 DIAGNOSIS — Z8551 Personal history of malignant neoplasm of bladder: Secondary | ICD-10-CM | POA: Diagnosis not present

## 2016-11-02 DIAGNOSIS — R350 Frequency of micturition: Secondary | ICD-10-CM | POA: Diagnosis not present

## 2017-01-06 DIAGNOSIS — Z23 Encounter for immunization: Secondary | ICD-10-CM | POA: Diagnosis not present

## 2017-04-25 DIAGNOSIS — C61 Malignant neoplasm of prostate: Secondary | ICD-10-CM | POA: Diagnosis not present

## 2017-05-23 DIAGNOSIS — Z8551 Personal history of malignant neoplasm of bladder: Secondary | ICD-10-CM | POA: Diagnosis not present

## 2017-05-23 DIAGNOSIS — N401 Enlarged prostate with lower urinary tract symptoms: Secondary | ICD-10-CM | POA: Diagnosis not present

## 2017-05-23 DIAGNOSIS — C61 Malignant neoplasm of prostate: Secondary | ICD-10-CM | POA: Diagnosis not present

## 2017-05-23 DIAGNOSIS — R350 Frequency of micturition: Secondary | ICD-10-CM | POA: Diagnosis not present

## 2017-06-20 DIAGNOSIS — I10 Essential (primary) hypertension: Secondary | ICD-10-CM | POA: Diagnosis not present

## 2017-06-20 DIAGNOSIS — R7301 Impaired fasting glucose: Secondary | ICD-10-CM | POA: Diagnosis not present

## 2017-06-27 DIAGNOSIS — Z0000 Encounter for general adult medical examination without abnormal findings: Secondary | ICD-10-CM | POA: Diagnosis not present

## 2017-06-27 DIAGNOSIS — I2510 Atherosclerotic heart disease of native coronary artery without angina pectoris: Secondary | ICD-10-CM | POA: Diagnosis not present

## 2017-06-27 DIAGNOSIS — I2721 Secondary pulmonary arterial hypertension: Secondary | ICD-10-CM | POA: Diagnosis not present

## 2017-06-27 DIAGNOSIS — I129 Hypertensive chronic kidney disease with stage 1 through stage 4 chronic kidney disease, or unspecified chronic kidney disease: Secondary | ICD-10-CM | POA: Diagnosis not present

## 2017-06-27 DIAGNOSIS — Z1389 Encounter for screening for other disorder: Secondary | ICD-10-CM | POA: Diagnosis not present

## 2017-06-27 DIAGNOSIS — Z6822 Body mass index (BMI) 22.0-22.9, adult: Secondary | ICD-10-CM | POA: Diagnosis not present

## 2017-06-27 DIAGNOSIS — C61 Malignant neoplasm of prostate: Secondary | ICD-10-CM | POA: Diagnosis not present

## 2017-06-27 DIAGNOSIS — I700 Atherosclerosis of aorta: Secondary | ICD-10-CM | POA: Diagnosis not present

## 2017-06-27 DIAGNOSIS — C679 Malignant neoplasm of bladder, unspecified: Secondary | ICD-10-CM | POA: Diagnosis not present

## 2017-06-27 DIAGNOSIS — E7849 Other hyperlipidemia: Secondary | ICD-10-CM | POA: Diagnosis not present

## 2017-06-27 DIAGNOSIS — N183 Chronic kidney disease, stage 3 (moderate): Secondary | ICD-10-CM | POA: Diagnosis not present

## 2017-06-27 DIAGNOSIS — I6990 Unspecified sequelae of unspecified cerebrovascular disease: Secondary | ICD-10-CM | POA: Diagnosis not present

## 2017-07-12 DIAGNOSIS — Z1212 Encounter for screening for malignant neoplasm of rectum: Secondary | ICD-10-CM | POA: Diagnosis not present

## 2017-07-27 DIAGNOSIS — M25512 Pain in left shoulder: Secondary | ICD-10-CM | POA: Diagnosis not present

## 2017-08-01 DIAGNOSIS — M25512 Pain in left shoulder: Secondary | ICD-10-CM | POA: Diagnosis not present

## 2017-10-26 DIAGNOSIS — H2513 Age-related nuclear cataract, bilateral: Secondary | ICD-10-CM | POA: Diagnosis not present

## 2017-10-26 DIAGNOSIS — H35362 Drusen (degenerative) of macula, left eye: Secondary | ICD-10-CM | POA: Diagnosis not present

## 2017-10-26 DIAGNOSIS — H1712 Central corneal opacity, left eye: Secondary | ICD-10-CM | POA: Diagnosis not present

## 2017-10-27 DIAGNOSIS — M25511 Pain in right shoulder: Secondary | ICD-10-CM | POA: Diagnosis not present

## 2017-12-06 DIAGNOSIS — C61 Malignant neoplasm of prostate: Secondary | ICD-10-CM | POA: Diagnosis not present

## 2017-12-14 DIAGNOSIS — N401 Enlarged prostate with lower urinary tract symptoms: Secondary | ICD-10-CM | POA: Diagnosis not present

## 2017-12-14 DIAGNOSIS — R350 Frequency of micturition: Secondary | ICD-10-CM | POA: Diagnosis not present

## 2017-12-14 DIAGNOSIS — Z8551 Personal history of malignant neoplasm of bladder: Secondary | ICD-10-CM | POA: Diagnosis not present

## 2017-12-14 DIAGNOSIS — C61 Malignant neoplasm of prostate: Secondary | ICD-10-CM | POA: Diagnosis not present

## 2017-12-14 DIAGNOSIS — R972 Elevated prostate specific antigen [PSA]: Secondary | ICD-10-CM | POA: Diagnosis not present

## 2017-12-22 DIAGNOSIS — Z23 Encounter for immunization: Secondary | ICD-10-CM | POA: Diagnosis not present

## 2018-04-23 ENCOUNTER — Emergency Department (HOSPITAL_COMMUNITY)
Admission: EM | Admit: 2018-04-23 | Discharge: 2018-04-23 | Disposition: A | Discharge status: Home / Self Care | Payer: Medicare Other | Attending: Emergency Medicine | Admitting: Emergency Medicine

## 2018-04-23 ENCOUNTER — Encounter (HOSPITAL_COMMUNITY): Payer: Self-pay

## 2018-04-23 ENCOUNTER — Other Ambulatory Visit: Payer: Self-pay

## 2018-04-23 ENCOUNTER — Emergency Department (HOSPITAL_COMMUNITY): Payer: Medicare Other

## 2018-04-23 DIAGNOSIS — Y939 Activity, unspecified: Secondary | ICD-10-CM | POA: Diagnosis not present

## 2018-04-23 DIAGNOSIS — Y999 Unspecified external cause status: Secondary | ICD-10-CM | POA: Diagnosis not present

## 2018-04-23 DIAGNOSIS — S0592XA Unspecified injury of left eye and orbit, initial encounter: Secondary | ICD-10-CM | POA: Diagnosis not present

## 2018-04-23 DIAGNOSIS — I10 Essential (primary) hypertension: Secondary | ICD-10-CM | POA: Diagnosis not present

## 2018-04-23 DIAGNOSIS — Z23 Encounter for immunization: Secondary | ICD-10-CM | POA: Insufficient documentation

## 2018-04-23 DIAGNOSIS — Z79899 Other long term (current) drug therapy: Secondary | ICD-10-CM | POA: Insufficient documentation

## 2018-04-23 DIAGNOSIS — W19XXXA Unspecified fall, initial encounter: Secondary | ICD-10-CM

## 2018-04-23 DIAGNOSIS — S0181XA Laceration without foreign body of other part of head, initial encounter: Secondary | ICD-10-CM | POA: Insufficient documentation

## 2018-04-23 DIAGNOSIS — Y929 Unspecified place or not applicable: Secondary | ICD-10-CM | POA: Diagnosis not present

## 2018-04-23 DIAGNOSIS — Z8551 Personal history of malignant neoplasm of bladder: Secondary | ICD-10-CM | POA: Insufficient documentation

## 2018-04-23 DIAGNOSIS — S01112A Laceration without foreign body of left eyelid and periocular area, initial encounter: Secondary | ICD-10-CM

## 2018-04-23 DIAGNOSIS — Z87891 Personal history of nicotine dependence: Secondary | ICD-10-CM | POA: Insufficient documentation

## 2018-04-23 DIAGNOSIS — W01198A Fall on same level from slipping, tripping and stumbling with subsequent striking against other object, initial encounter: Secondary | ICD-10-CM | POA: Insufficient documentation

## 2018-04-23 DIAGNOSIS — S0990XA Unspecified injury of head, initial encounter: Secondary | ICD-10-CM | POA: Insufficient documentation

## 2018-04-23 MED ORDER — LIDOCAINE-EPINEPHRINE (PF) 2 %-1:200000 IJ SOLN
10.0000 mL | Freq: Once | INTRAMUSCULAR | Status: AC
  Administered 2018-04-23: 10 mL
  Filled 2018-04-23: qty 20

## 2018-04-23 MED ORDER — TETANUS-DIPHTH-ACELL PERTUSSIS 5-2.5-18.5 LF-MCG/0.5 IM SUSP
0.5000 mL | Freq: Once | INTRAMUSCULAR | Status: AC
  Administered 2018-04-23: 0.5 mL via INTRAMUSCULAR
  Filled 2018-04-23: qty 0.5

## 2018-04-23 NOTE — ED Notes (Signed)
Pt reports taking plavix.

## 2018-04-23 NOTE — Discharge Instructions (Addendum)
You were seen in the ER for fall and laceration. Your laceration was repaired with 8 sutures today. You were given a tetanus shot. CT scans showed chronic, ischemic changes but no intracranial bleeding or bone fractures in your head or orbits.    Your sutures need to come out within 5-7 days.   Laceration can then be gently cleaned with mild soap and water after 24 hours of laceration repair to prevent crusting over the suture knots. An antibiotic ointment can be applied to the wound as well, twice daily until suture removal.   Avoid prolonged soaking of nonabsorbable stitches including swimming in chlorinated water should be avoided because of the theoretical risk of premature loss of suture tensile strength with wound dehiscence. Patients with sutures should also not swim in natural bodies of water because of a potential increased risk of infection.   Return for signs of infection including redness, warmth, pus, fevers.  Return for severe headache, vision changes or loss, unilateral loss of sensation or strength, difficulty with speech or ambulation.

## 2018-04-23 NOTE — ED Triage Notes (Signed)
Pt reports mechanical fall resulting in a laceration at his L eyebrow. Bleeding controlled. Pt denies LOC or neuro deficits. A&Ox4. Ambulatory. No thinners.

## 2018-04-23 NOTE — ED Provider Notes (Addendum)
[facility] DEPT Provider Note   CSN: [id number] Arrival date & time: 04/23/18  1929     History   Chief Complaint Chief Complaint  Patient presents with  . Laceration    HPI Philip Ward is a 78 y.o. male with h/o ischemic stroke on plavix, HTN, is here for evaluation of left eye eyebrow laceration sustained during mechanical fall prior to arrival.  He was walking and did not see tree roots and tripped, he fell forward striking his forehead on the cement.  He wears glasses and thinks the edge of the glasses cut him.  He has approximately 1.5 inch straight laceration right below the left eyebrow.  He has minimal local pain and bruising and swelling.  He denies LOC.  He denies associated headache, vision changes or loss, nausea, vomiting, difficulty with speech or walking, loss of sensation or strength unilaterally.  No interventions.  No alleviating factors.  Wound is oozing. Unknown tetanus status.  HPI  Past Medical History:  Diagnosis Date  . Arthritis   . Atherosclerosis of aorta (HCC)    moderate per TEE  . Bradycardia    chronic, asymptomatic  . External carotid artery stenosis    LECA  moderate stenosis per duplex  . History of bladder cancer urologist-  dr Jeffie Pollock  . History of colon polyps    hyperplastic  . History of CVA with residual deficit neurologist-  dr Leonie Man   x2 CVA- ;    06-30-2011 left basal ganglia infarct;   10-26-2011  right thalamic infart , residual mild left face and hand  . History of PSVT (paroxysmal supraventricular tachycardia) 08-02-2011    17 beats NSVT documented on event monitor (asymptomatic)  . History of syncope    2013  felt to vasovagal response  . Hypertension   . Lumbar stenosis   . Premature atrial contractions     Patient Active Problem List   Diagnosis Date Noted  . Unspecified cerebral artery occlusion with cerebral infarction 10/26/2012  . Bradycardia 07/02/2011  . Acute cerebrovascular  accident ([place]) 06/30/2011  . Dysarthria due to cerebrovascular accident 06/30/2011  . Hypertension 06/30/2011  . Irregular heartbeat 06/30/2011  . History of syncope 06/30/2011  . Nicotine abuse 06/30/2011    Past Surgical History:  Procedure Laterality Date  . CYSTOSCOPY WITH BIOPSY N/A 02/18/2015   Procedure: CYSTOSCOPY WITH BLADDER BIOPSY;  Surgeon: Irine Seal, MD;  Location: [facility];  Service: Urology;  Laterality: N/A;  . CYSTOSTOMY W/ BLADDER BIOPSY  05-05-2010  . REPAIR LACERATED CORNEA LEFT EYE  yrs ago  . TEE WITHOUT CARDIOVERSION  10/28/2011   Procedure: TRANSESOPHAGEAL ECHOCARDIOGRAM (TEE);  Surgeon: Lelon Perla, MD;  Location: [facility] ENDOSCOPY;  Service: Cardiovascular;  Laterality: N/A;    normal LV,  moderate descending aorta atherosclerosis  . TRANSTHORACIC ECHOCARDIOGRAM  06-29-2011   mild LVH,  ef 55-60%,  mild MR and TR  . TRANSURETHRAL RESECTION OF BLADDER TUMOR WITH MITOMYCIN-C  03-24-2010        Home Medications    Prior to Admission medications   Medication Sig Start Date End Date Taking? Authorizing Provider  amLODipine (NORVASC) 10 MG tablet Take 10 mg by mouth every morning.    [provider]  fenofibrate 160 MG tablet Take 160 mg by mouth every morning.    [provider]  hydrochlorothiazide (MICROZIDE) 12.5 MG capsule Take 12.5 mg by mouth every morning.    [provider]  Multiple Vitamin Edd Fabian  WITH MINERALS) TABS Take 1 tablet by mouth daily.    [provider]  Naproxen Sodium (ALEVE PO) Take by mouth as needed.    [provider]  pantoprazole (PROTONIX) 40 MG tablet Take 40 mg by mouth every morning.    [provider]  vitamin C (ASCORBIC ACID) 500 MG tablet Take 500 mg by mouth daily.    [provider]    Family History Family History  Problem Relation Age of Onset  . Heart Problems Mother     Social History Social History   Tobacco Use  .  Smoking status: Former Smoker    Years: 25.00    Types: Pipe  . Smokeless tobacco: Never Used  . Tobacco comment: using chantix and trying to quit  Substance Use Topics  . Alcohol use: Yes    Alcohol/week: 7.0 standard drinks    Types: 2 Glasses of wine, 5 Cans of beer per week  . Drug use: No     Allergies   Statins   Review of Systems Review of Systems  Skin: Positive for wound.  Hematological: Bruises/bleeds easily.  All other systems reviewed and are negative.    Physical Exam Updated Vital Signs BP 124/63 (BP Location: Left Arm)   Pulse (!) 59   Temp (!) 97.4 F (36.3 C) (Oral)   Resp 14   SpO2 100%   Physical Exam Vitals signs and nursing note reviewed.  Constitutional:      General: He is not in acute distress.    Appearance: He is well-developed.     Comments: NAD.  HENT:     Head: Normocephalic. Laceration present.     Comments: approx 8 cm straight laceration right below left eye brow oozing blood, mild tenderness, ecchymosis and edema inferiorly.  No other facial, scalp bone tenderness.     Right Ear: External ear normal.     Left Ear: External ear normal.     Ears:     Comments: No battle's sign    Nose: Nose normal.     Mouth/Throat:     Comments: No intraoral or tongue injury/bleeding  Eyes:     General: No scleral icterus.    Conjunctiva/sclera: Conjunctivae normal.     Comments: PERRL and EOMs intact without pain.  Slightly gaping laceration (medially) below left eye brow, I do not see obvious exposed adipose or muscle tissue. Full ROM of eye brows without lag. Conjunctiva and sclera normal.   Neck:     Musculoskeletal: Normal range of motion and neck supple.     Comments: c-spine: no midline or paraspinal muscle tenderness. Full ROM of neck without pain.  Cardiovascular:     Rate and Rhythm: Normal rate and regular rhythm.     Heart sounds: Normal heart sounds. No murmur.  Pulmonary:     Effort: Pulmonary effort is normal.     Breath  sounds: Normal breath sounds. No wheezing.  Musculoskeletal: Normal range of motion.        General: No deformity.  Skin:    General: Skin is warm and dry.     Capillary Refill: Capillary refill takes less than 2 seconds.     Findings: Ecchymosis and laceration present.     Comments: See above   Neurological:     Mental Status: He is alert and oriented to person, place, and time.     Comments: Alert and oriented to self, place, time and event.  Speech is fluent without obvious dysarthria  or aphasia. Strength 5/5 in upper and lower extremities   Sensation to light touch intact in bilateral face, upper and lower extremities No truncal sway. No pronator drift. No leg drop.  Normal finger-to-nose and finger tapping.  CN II-XII grossly intact bilaterally.   Psychiatric:        Behavior: Behavior normal.        Thought Content: Thought content normal.        Judgment: Judgment normal.      ED Treatments / Results  Labs (all labs ordered are listed, but only abnormal results are displayed) Labs Reviewed - No data to display  EKG None  Radiology Ct Head Wo Contrast  Result Date: 04/23/2018 CLINICAL DATA:  Tripped and fell, hit LEFT eye.  On Plavix. EXAM: CT HEAD AND ORBITS WITHOUT CONTRAST TECHNIQUE: Contiguous axial images were obtained from the base of the skull through the vertex without contrast. Multidetector CT imaging of the orbits was performed using the standard protocol without intravenous contrast. COMPARISON:  MRI of the head October 26, 2011 FINDINGS: CT HEAD FINDINGS BRAIN: No intraparenchymal hemorrhage, mass effect nor midline shift. No parenchymal brain volume loss for age. No hydrocephalus. Confluent supratentorial white matter hypodensities. Old RIGHT thalamus and bilateral basal ganglia small infarcts. No acute large vascular territory infarcts. No abnormal extra-axial fluid collections. Basal cisterns are patent. VASCULAR: Moderate calcific atherosclerosis of the carotid  siphons. SKULL: No skull fracture. No significant scalp soft tissue swelling. OTHER: None. CT ORBITS FINDINGS ORBITS: Intact ocular globes. Lenses are located. Normal appearance of the optic nerve sheath complexes. Preservation of the orbital fat. Normal appearance of the extraocular muscles which are well located. Superior ophthalmic veins are not enlarged. VISUALIZED SINUSES: Mild paranasal sinus mucosal thickening without air-fluid levels. LEFT concha bullosa. Nasal septum deviated to the RIGHT. Mastoid air cells are well aerated. SOFT TISSUES/BONES: LEFT periorbital soft tissue swelling without subcutaneous gas or radiopaque foreign bodies. IMPRESSION: CT HEAD: 1. No acute intracranial process. 2. Moderate to severe chronic small vessel ischemic changes and old lacunar infarcts. CT ORBITS: 1. LEFT periorbital soft tissue swelling/contusion without postseptal extent. 2. No orbital fracture. Electronically Signed   By: Elon Alas M.D.   On: 04/23/2018 22:09   Ct Orbits Wo Contrast  Result Date: 04/23/2018 CLINICAL DATA:  Tripped and fell, hit LEFT eye.  On Plavix. EXAM: CT HEAD AND ORBITS WITHOUT CONTRAST TECHNIQUE: Contiguous axial images were obtained from the base of the skull through the vertex without contrast. Multidetector CT imaging of the orbits was performed using the standard protocol without intravenous contrast. COMPARISON:  MRI of the head October 26, 2011 FINDINGS: CT HEAD FINDINGS BRAIN: No intraparenchymal hemorrhage, mass effect nor midline shift. No parenchymal brain volume loss for age. No hydrocephalus. Confluent supratentorial white matter hypodensities. Old RIGHT thalamus and bilateral basal ganglia small infarcts. No acute large vascular territory infarcts. No abnormal extra-axial fluid collections. Basal cisterns are patent. VASCULAR: Moderate calcific atherosclerosis of the carotid siphons. SKULL: No skull fracture. No significant scalp soft tissue swelling. OTHER: None. CT ORBITS  FINDINGS ORBITS: Intact ocular globes. Lenses are located. Normal appearance of the optic nerve sheath complexes. Preservation of the orbital fat. Normal appearance of the extraocular muscles which are well located. Superior ophthalmic veins are not enlarged. VISUALIZED SINUSES: Mild paranasal sinus mucosal thickening without air-fluid levels. LEFT concha bullosa. Nasal septum deviated to the RIGHT. Mastoid air cells are well aerated. SOFT TISSUES/BONES: LEFT periorbital soft tissue swelling without subcutaneous gas or radiopaque foreign  bodies. IMPRESSION: CT HEAD: 1. No acute intracranial process. 2. Moderate to severe chronic small vessel ischemic changes and old lacunar infarcts. CT ORBITS: 1. LEFT periorbital soft tissue swelling/contusion without postseptal extent. 2. No orbital fracture. Electronically Signed   By: Elon Alas M.D.   On: 04/23/2018 22:09    Procedures .Marland KitchenLaceration Repair Date/Time: 04/25/2018 10:39 AM Performed by: Kinnie Feil, PA-C Authorized by: Kinnie Feil, PA-C   Consent:    Consent obtained:  Verbal   Consent given by:  Patient   Risks discussed:  Infection, pain and poor cosmetic result   Alternatives discussed:  No treatment Universal protocol:    Procedure explained and questions answered to patient or proxy's satisfaction: yes     Imaging studies available: yes     Required blood products, implants, devices, and special equipment available: yes     Site/side marked: yes     Immediately prior to procedure, a time out was called: yes     Patient identity confirmed:  Verbally with patient Anesthesia (see MAR for exact dosages):    Anesthesia method:  Local infiltration   Local anesthetic:  Lidocaine 2% WITH epi Laceration details:    Location:  Face   Face location:  L eyebrow   Length (cm):  8 Repair type:    Repair type:  Intermediate Pre-procedure details:    Preparation:  Patient was prepped and draped in usual sterile fashion and  imaging obtained to evaluate for foreign bodies Exploration:    Hemostasis achieved with:  Epinephrine and direct pressure   Wound exploration: wound explored through full range of motion and entire depth of wound probed and visualized     Wound extent: no foreign bodies/material noted, no muscle damage noted, no nerve damage noted and no underlying fracture noted     Contaminated: no   Treatment:    Area cleansed with:  Betadine   Amount of cleaning:  Standard   Irrigation solution:  Sterile saline   Irrigation volume:  20 cc    Irrigation method:  Syringe   Visualized foreign bodies/material removed: no   Skin repair:    Repair method:  Sutures   Suture size:  5-0   Suture material:  Prolene   Suture technique:  Simple interrupted   Number of sutures:  8 Approximation:    Approximation:  Close Post-procedure details:    Dressing:  Antibiotic ointment   Patient tolerance of procedure:  Tolerated well, no immediate complications   (including critical care time)  Medications Ordered in ED Medications  Tdap (BOOSTRIX) injection 0.5 mL (0.5 mLs Intramuscular Given 04/23/18 2119)  lidocaine-EPINEPHrine (XYLOCAINE W/EPI) 2 %-1:200000 (PF) injection 10 mL (10 mLs Infiltration Given by Other 04/23/18 2118)     Initial Impression / Assessment and Plan / ED Course  I have reviewed the triage vital signs and the nursing notes.  Pertinent labs & imaging results that were available during my care of the patient were reviewed by me and considered in my medical decision making (see chart for details).  Clinical Course as of Apr 23 2253  Nancy Fetter Apr 23, 2018  2223 IMPRESSION: CT HEAD: 1. No acute intracranial process. 2. Moderate to severe chronic small vessel ischemic changes and old lacunar infarcts.  CT ORBITS:  1. LEFT periorbital soft tissue swelling/contusion without postseptal extent. 2. No orbital fracture.  CT Head Wo Contrast [CG]    Clinical Course User Index [CG] Kinnie Feil, PA-C   78 y.o.  yo male here after mechanical fall.  Minimal local pain to area of laceration.  HD stable on arrival.  Fully oriented. Although no obvious signs of significant head, c-spine injury he is on plavix.  Head and orbit CT w/o acute injuries.  Results given to patients. Laceration repaired in ER w/o immediate complications. Wound does not extend deep into muscle layers and area has normal motor and sensory function. Pressure irrigation performed. Bottom of the wound visualized with bleeding controled, no foreign bodies seen. Tdap updated.  Return precautions given.   Final Clinical Impressions(s) / ED Diagnoses   Final diagnoses:  Fall, initial encounter  Laceration of left eyebrow, initial encounter    ED Discharge Orders    None       Arlean Hopping 04/23/18 2255    Mesner, Corene Cornea, MD 04/24/18 0031    Kinnie Feil, PA-C 04/25/18 1042    Mesner, Corene Cornea, MD 04/26/18 [phone]

## 2018-04-30 ENCOUNTER — Encounter (HOSPITAL_COMMUNITY): Payer: Self-pay

## 2018-04-30 ENCOUNTER — Other Ambulatory Visit: Payer: Self-pay

## 2018-04-30 ENCOUNTER — Emergency Department (HOSPITAL_COMMUNITY)
Admission: EM | Admit: 2018-04-30 | Discharge: 2018-04-30 | Disposition: A | Discharge status: Home / Self Care | Payer: Medicare Other | Attending: Emergency Medicine | Admitting: Emergency Medicine

## 2018-04-30 DIAGNOSIS — Z8673 Personal history of transient ischemic attack (TIA), and cerebral infarction without residual deficits: Secondary | ICD-10-CM | POA: Insufficient documentation

## 2018-04-30 DIAGNOSIS — I10 Essential (primary) hypertension: Secondary | ICD-10-CM | POA: Diagnosis not present

## 2018-04-30 DIAGNOSIS — Z79899 Other long term (current) drug therapy: Secondary | ICD-10-CM | POA: Diagnosis not present

## 2018-04-30 DIAGNOSIS — Z8551 Personal history of malignant neoplasm of bladder: Secondary | ICD-10-CM | POA: Diagnosis not present

## 2018-04-30 DIAGNOSIS — Z4802 Encounter for removal of sutures: Secondary | ICD-10-CM | POA: Diagnosis not present

## 2018-04-30 DIAGNOSIS — Z87891 Personal history of nicotine dependence: Secondary | ICD-10-CM | POA: Insufficient documentation

## 2018-04-30 NOTE — Discharge Instructions (Addendum)
You were seen in the emergency department today for suture removal.  Please keep the area covered when in the sun to avoid scarring. Follow up with primary care for general recheck within 1 week.  Return to the ER for new or worsening symptoms including but not limited to reopening of the wound, redness around the wound, pus draining from it, fever, or any other concerns.

## 2018-04-30 NOTE — ED Provider Notes (Signed)
[facility] DEPT Provider Note   CSN: [id number] Arrival date & time: 04/30/18  1005     History   Chief Complaint Chief Complaint  Patient presents with  . Suture / Staple Removal    HPI Philip Ward is a 78 y.o. male who returns to the emergency department today for suture removal.  Patient seen in the emergency department 04/23/17 s/p mechanical fall with head injury and laceration just inferior to the L eyebrow.  CT head and maxillofacial obtained without intracranial hemorrhage or fracture/dislocation.  He has been doing well since discharge.  He has not had any erythema or drainage to the laceration site.  Denies fever or chills.  States he is here for suture removal and has no other complaints. No alleviating/aggravating factors.    HPI  Past Medical History:  Diagnosis Date  . Arthritis   . Atherosclerosis of aorta (HCC)    moderate per TEE  . Bradycardia    chronic, asymptomatic  . External carotid artery stenosis    LECA  moderate stenosis per duplex  . History of bladder cancer urologist-  dr Jeffie Pollock  . History of colon polyps    hyperplastic  . History of CVA with residual deficit neurologist-  dr Leonie Man   x2 CVA- ;    06-30-2011 left basal ganglia infarct;   10-26-2011  right thalamic infart , residual mild left face and hand  . History of PSVT (paroxysmal supraventricular tachycardia) 08-02-2011    17 beats NSVT documented on event monitor (asymptomatic)  . History of syncope    2013  felt to vasovagal response  . Hypertension   . Lumbar stenosis   . Premature atrial contractions     Patient Active Problem List   Diagnosis Date Noted  . Unspecified cerebral artery occlusion with cerebral infarction 10/26/2012  . Bradycardia 07/02/2011  . Acute cerebrovascular accident ([place]) 06/30/2011  . Dysarthria due to cerebrovascular accident 06/30/2011  . Hypertension 06/30/2011  . Irregular heartbeat 06/30/2011  . History of  syncope 06/30/2011  . Nicotine abuse 06/30/2011    Past Surgical History:  Procedure Laterality Date  . CYSTOSCOPY WITH BIOPSY N/A 02/18/2015   Procedure: CYSTOSCOPY WITH BLADDER BIOPSY;  Surgeon: Irine Seal, MD;  Location: [facility];  Service: Urology;  Laterality: N/A;  . CYSTOSTOMY W/ BLADDER BIOPSY  05-05-2010  . REPAIR LACERATED CORNEA LEFT EYE  yrs ago  . TEE WITHOUT CARDIOVERSION  10/28/2011   Procedure: TRANSESOPHAGEAL ECHOCARDIOGRAM (TEE);  Surgeon: Lelon Perla, MD;  Location: [facility];  Service: Cardiovascular;  Laterality: N/A;    normal LV,  moderate descending aorta atherosclerosis  . TRANSTHORACIC ECHOCARDIOGRAM  06-29-2011   mild LVH,  ef 55-60%,  mild MR and TR  . TRANSURETHRAL RESECTION OF BLADDER TUMOR WITH MITOMYCIN-C  03-24-2010        Home Medications    Prior to Admission medications   Medication Sig Start Date End Date Taking? Authorizing Provider  amLODipine (NORVASC) 10 MG tablet Take 10 mg by mouth every morning.    [provider]  fenofibrate 160 MG tablet Take 160 mg by mouth every morning.    [provider]  hydrochlorothiazide (MICROZIDE) 12.5 MG capsule Take 12.5 mg by mouth every morning.    [provider]  Multiple Vitamin (MULITIVITAMIN WITH MINERALS) TABS Take 1 tablet by mouth daily.    [provider]  Naproxen Sodium (ALEVE PO) Take by mouth as needed.    [provider]  pantoprazole (PROTONIX) 40 MG tablet Take 40 mg by mouth every morning.    [provider]  vitamin C (ASCORBIC ACID) 500 MG tablet Take 500 mg by mouth daily.    [provider]    Family History Family History  Problem Relation Age of Onset  . Heart Problems Mother     Social History Social History   Tobacco Use  . Smoking status: Former Smoker    Years: 25.00    Types: Pipe  . Smokeless tobacco: Never Used  . Tobacco comment: using chantix and trying to quit  Substance Use  Topics  . Alcohol use: Yes    Alcohol/week: 7.0 standard drinks    Types: 2 Glasses of wine, 5 Cans of beer per week  . Drug use: No     Allergies   Statins   Review of Systems Review of Systems  Constitutional: Negative for chills and fever.  Gastrointestinal: Negative for nausea and vomiting.  Skin: Positive for wound. Negative for color change.     Physical Exam Updated Vital Signs BP (!) 146/64 (BP Location: Left Arm)   Pulse 63   Temp 97.9 F (36.6 C)   Resp 16   Wt 68 kg   SpO2 100%   BMI 20.92 kg/m   Physical Exam Vitals signs and nursing note reviewed.  Constitutional:      General: He is not in acute distress.    Appearance: He is well-developed.  HENT:     Head: Normocephalic and atraumatic.     Comments: There is an approximately 8 cm healed laceration to the area just inferior to the left eyebrow.  There are 8 sutures in place.  No surrounding erythema or warmth.  No notable swelling.  No purulent drainage.  Nontender to palpation.  There is some old ecchymosis to the inferior periorbital area. Eyes:     General:        Right eye: No discharge.        Left eye: No discharge.     Conjunctiva/sclera: Conjunctivae normal.     Pupils: Pupils are equal, round, and reactive to light.  Neurological:     Mental Status: He is alert.     Comments: Clear speech.   Psychiatric:        Behavior: Behavior normal.        Thought Content: Thought content normal.    ED Treatments / Results  Labs (all labs ordered are listed, but only abnormal results are displayed) Labs Reviewed - No data to display  EKG None  Radiology No results found.  Procedures .Suture Removal Date/Time: 04/30/2018 11:22 AM Performed by: Amaryllis Dyke, PA-C Authorized by: Amaryllis Dyke, PA-C   Consent:    Consent obtained:  Verbal   Consent given by:  Patient   Risks discussed:  Bleeding, pain and wound separation   Alternatives discussed:  No  treatment Location:    Location:  Head/neck   Head/neck location:  Eyebrow   Eyebrow location:  L eyebrow Procedure details:    Wound appearance:  No signs of infection   Number of sutures removed:  8 Post-procedure details:    Patient tolerance of procedure:  Tolerated well, no immediate complications   (including critical care time)  Medications Ordered in ED Medications - No data to display   Initial Impression / Assessment and Plan / ED Course  I have reviewed the triage vital signs and the nursing notes.  Pertinent labs &  imaging results that were available during my care of the patient were reviewed by me and considered in my medical decision making (see chart for details).    Patient presents to the ER for staple/suture removal and wound check as above. Procedure tolerated well. No signs of infection. Scar minimization & return precautions given at discharge.   Final Clinical Impressions(s) / ED Diagnoses   Final diagnoses:  Visit for suture removal    ED Discharge Orders    None       Amaryllis Dyke, PA-C 04/30/18 1124    Charlesetta Shanks, MD 05/05/18 1342

## 2018-04-30 NOTE — ED Notes (Signed)
Bed: WTR7 Expected date:  Expected time:  Means of arrival:  Comments: 

## 2018-04-30 NOTE — ED Triage Notes (Signed)
Pt states he needs to get his sutures removed over his left eye.

## 2018-05-29 DIAGNOSIS — C61 Malignant neoplasm of prostate: Secondary | ICD-10-CM | POA: Diagnosis not present

## 2018-06-05 DIAGNOSIS — R972 Elevated prostate specific antigen [PSA]: Secondary | ICD-10-CM | POA: Diagnosis not present

## 2018-06-05 DIAGNOSIS — N403 Nodular prostate with lower urinary tract symptoms: Secondary | ICD-10-CM | POA: Diagnosis not present

## 2018-06-05 DIAGNOSIS — C61 Malignant neoplasm of prostate: Secondary | ICD-10-CM | POA: Diagnosis not present

## 2018-06-05 DIAGNOSIS — Z8551 Personal history of malignant neoplasm of bladder: Secondary | ICD-10-CM | POA: Diagnosis not present

## 2018-06-05 DIAGNOSIS — R3915 Urgency of urination: Secondary | ICD-10-CM | POA: Diagnosis not present

## 2018-12-20 DIAGNOSIS — Z8551 Personal history of malignant neoplasm of bladder: Secondary | ICD-10-CM | POA: Diagnosis not present

## 2018-12-20 DIAGNOSIS — R350 Frequency of micturition: Secondary | ICD-10-CM | POA: Diagnosis not present

## 2018-12-20 DIAGNOSIS — N403 Nodular prostate with lower urinary tract symptoms: Secondary | ICD-10-CM | POA: Diagnosis not present

## 2018-12-20 DIAGNOSIS — C61 Malignant neoplasm of prostate: Secondary | ICD-10-CM | POA: Diagnosis not present

## 2018-12-21 DIAGNOSIS — Z23 Encounter for immunization: Secondary | ICD-10-CM | POA: Diagnosis not present

## 2019-04-15 ENCOUNTER — Ambulatory Visit: Payer: Medicare Other | Attending: Internal Medicine

## 2019-04-15 DIAGNOSIS — Z23 Encounter for immunization: Secondary | ICD-10-CM

## 2019-04-15 NOTE — Progress Notes (Signed)
   Covid-19 Vaccination Clinic  Name:  Philip Ward    MRN: [id number] DOB: May 25, 1940  04/15/2019  [name] was observed post Covid-19 immunization for 15 minutes without incidence. He was provided with Vaccine Information Sheet and instruction to access the V-Safe system.   [name] was instructed to call 911 with any severe reactions post vaccine: Marland Kitchen Difficulty breathing  . Swelling of your face and throat  . A fast heartbeat  . A bad rash all over your body  . Dizziness and weakness    Immunizations Administered    Name Date Dose VIS Date Route   Pfizer COVID-19 Vaccine 04/15/2019 12:05 PM 0.3 mL 03/02/2019 Intramuscular   Manufacturer: [place]   Lot: [id number]   [place]: [id number]

## 2019-05-07 ENCOUNTER — Ambulatory Visit: Payer: Medicare Other | Attending: Internal Medicine

## 2019-05-07 DIAGNOSIS — Z23 Encounter for immunization: Secondary | ICD-10-CM

## 2019-05-07 NOTE — Progress Notes (Signed)
   Covid-19 Vaccination Clinic  Name:  Philip Ward    MRN: [id number] DOB: November 07, 1940  05/07/2019  Mr. [name] was observed post Covid-19 immunization for 15 minutes without incidence. He was provided with Vaccine Information Sheet and instruction to access the V-Safe system.   Mr. [name] was instructed to call 911 with any severe reactions post vaccine: Marland Kitchen Difficulty breathing  . Swelling of your face and throat  . A fast heartbeat  . A bad rash all over your body  . Dizziness and weakness    Immunizations Administered    Name Date Dose VIS Date Route   Pfizer COVID-19 Vaccine 05/07/2019 10:55 AM 0.3 mL 03/02/2019 Intramuscular   Manufacturer: [place]   Lot: [id number]   [place]: [id number]

## 2019-11-05 ENCOUNTER — Emergency Department (HOSPITAL_COMMUNITY)
Admission: EM | Admit: 2019-11-05 | Discharge: 2019-11-05 | Disposition: A | Discharge status: Home / Self Care | Payer: Medicare Other | Attending: Emergency Medicine | Admitting: Emergency Medicine

## 2019-11-05 ENCOUNTER — Emergency Department (HOSPITAL_COMMUNITY): Payer: Medicare Other

## 2019-11-05 ENCOUNTER — Other Ambulatory Visit: Payer: Self-pay

## 2019-11-05 ENCOUNTER — Encounter (HOSPITAL_COMMUNITY): Payer: Self-pay

## 2019-11-05 DIAGNOSIS — R55 Syncope and collapse: Secondary | ICD-10-CM | POA: Diagnosis not present

## 2019-11-05 DIAGNOSIS — Y929 Unspecified place or not applicable: Secondary | ICD-10-CM | POA: Diagnosis not present

## 2019-11-05 DIAGNOSIS — W19XXXA Unspecified fall, initial encounter: Secondary | ICD-10-CM | POA: Insufficient documentation

## 2019-11-05 DIAGNOSIS — Y939 Activity, unspecified: Secondary | ICD-10-CM | POA: Diagnosis not present

## 2019-11-05 DIAGNOSIS — M542 Cervicalgia: Secondary | ICD-10-CM | POA: Insufficient documentation

## 2019-11-05 DIAGNOSIS — E860 Dehydration: Secondary | ICD-10-CM | POA: Diagnosis not present

## 2019-11-05 DIAGNOSIS — S0990XA Unspecified injury of head, initial encounter: Secondary | ICD-10-CM | POA: Diagnosis not present

## 2019-11-05 DIAGNOSIS — Y999 Unspecified external cause status: Secondary | ICD-10-CM | POA: Diagnosis not present

## 2019-11-05 LAB — COMPREHENSIVE METABOLIC PANEL
ALT: 16 U/L (ref 0–44)
AST: 22 U/L (ref 15–41)
Albumin: 3.6 g/dL (ref 3.5–5.0)
Alkaline Phosphatase: 41 U/L (ref 38–126)
Anion gap: 16 — ABNORMAL HIGH (ref 5–15)
BUN: 24 mg/dL — ABNORMAL HIGH (ref 8–23)
CO2: 17 mmol/L — ABNORMAL LOW (ref 22–32)
Calcium: 9.1 mg/dL (ref 8.9–10.3)
Chloride: 101 mmol/L (ref 98–111)
Creatinine, Ser: 1.66 mg/dL — ABNORMAL HIGH (ref 0.61–1.24)
GFR calc Af Amer: 44 mL/min — ABNORMAL LOW (ref >60)
GFR calc non Af Amer: 38 mL/min — ABNORMAL LOW (ref >60)
Glucose, Bld: 95 mg/dL (ref 70–99)
Potassium: 3.4 mmol/L — ABNORMAL LOW (ref 3.5–5.1)
Sodium: 134 mmol/L — ABNORMAL LOW (ref 135–145)
Total Bilirubin: 0.6 mg/dL (ref 0.3–1.2)
Total Protein: 6.1 g/dL — ABNORMAL LOW (ref 6.5–8.1)

## 2019-11-05 LAB — CBC WITH DIFFERENTIAL/PLATELET
Abs Immature Granulocytes: 0.04 10*3/uL (ref 0.00–0.07)
Basophils Absolute: 0 10*3/uL (ref 0.0–0.1)
Basophils Relative: 1 %
Eosinophils Absolute: 0.1 10*3/uL (ref 0.0–0.5)
Eosinophils Relative: 2 %
HCT: 29.3 % — ABNORMAL LOW (ref 39.0–52.0)
Hemoglobin: 9.7 g/dL — ABNORMAL LOW (ref 13.0–17.0)
Immature Granulocytes: 1 %
Lymphocytes Relative: 31 %
Lymphs Abs: 1.8 10*3/uL (ref 0.7–4.0)
MCH: 31.6 pg (ref 26.0–34.0)
MCHC: 33.1 g/dL (ref 30.0–36.0)
MCV: 95.4 fL (ref 80.0–100.0)
Monocytes Absolute: 0.7 10*3/uL (ref 0.1–1.0)
Monocytes Relative: 12 %
Neutro Abs: 3.1 10*3/uL (ref 1.7–7.7)
Neutrophils Relative %: 53 %
Platelets: 216 10*3/uL (ref 150–400)
RBC: 3.07 MIL/uL — ABNORMAL LOW (ref 4.22–5.81)
RDW: 13.1 % (ref 11.5–15.5)
WBC: 5.7 10*3/uL (ref 4.0–10.5)
nRBC: 0 % (ref 0.0–0.2)

## 2019-11-05 LAB — MAGNESIUM: Magnesium: 1.8 mg/dL (ref 1.7–2.4)

## 2019-11-05 LAB — PROTIME-INR
INR: 1 (ref 0.8–1.2)
Prothrombin Time: 12.5 seconds (ref 11.4–15.2)

## 2019-11-05 LAB — TROPONIN I (HIGH SENSITIVITY)
Troponin I (High Sensitivity): 11 ng/L (ref <18)
Troponin I (High Sensitivity): 9 ng/L (ref <18)

## 2019-11-05 MED ORDER — SODIUM CHLORIDE 0.9 % IV BOLUS
1000.0000 mL | Freq: Once | INTRAVENOUS | Status: AC
  Administered 2019-11-05: 1000 mL via INTRAVENOUS

## 2019-11-05 NOTE — ED Notes (Signed)
Patient transported to CT 

## 2019-11-05 NOTE — ED Notes (Signed)
Pt returned from CT at this time.  

## 2019-11-05 NOTE — Progress Notes (Signed)
   11/05/19 2000  Clinical Encounter Type  Visited With Patient not available  Referral From Nurse  Consult/Referral To Chaplain  Patient not available. Conard Novak, Chaplain

## 2019-11-05 NOTE — ED Notes (Signed)
Portable xray at bedside.

## 2019-11-05 NOTE — ED Provider Notes (Signed)
[facility] EMERGENCY DEPARTMENT Provider Note   CSN: [id number] Arrival date & time: 11/05/19  2001     History Chief Complaint  Patient presents with  . Fall    Philip Ward is a 79 y.o. male.  The history is provided by the patient and medical records. No language interpreter was used.  Loss of Consciousness Most recent episode:  Today Timing:  Rare Progression:  Resolved Chronicity:  New Context: standing up   Witnessed: no   Relieved by:  Nothing Worsened by:  Nothing Ineffective treatments:  None tried Associated symptoms: headaches   Associated symptoms: no chest pain, no diaphoresis, no dizziness, no fever, no malaise/fatigue, no nausea, no palpitations, no recent fall, no recent injury, no seizures, no shortness of breath, no visual change, no vomiting and no weakness        Past Medical History:  Diagnosis Date  . Arthritis   . Stroke [facility])     There are no problems to display for this patient.    No family history on file.  Social History   Tobacco Use  . Smoking status: Not on file  Substance Use Topics  . Alcohol use: Not on file  . Drug use: Not on file    Home Medications Prior to Admission medications   Not on File    Allergies    Patient has no allergy information on record.  Review of Systems   Review of Systems  Constitutional: Negative for chills, diaphoresis, fatigue, fever and malaise/fatigue.  HENT: Negative for congestion.   Eyes: Negative for visual disturbance.  Respiratory: Negative for cough, choking, chest tightness, shortness of breath, wheezing and stridor.   Cardiovascular: Positive for syncope. Negative for chest pain, palpitations and leg swelling.  Gastrointestinal: Negative for constipation, diarrhea, nausea and vomiting.  Genitourinary: Negative for flank pain.  Musculoskeletal: Positive for neck pain. Negative for back pain and neck stiffness.  Skin: Negative for rash and wound.    Neurological: Positive for syncope and headaches. Negative for dizziness, seizures, facial asymmetry, speech difficulty, weakness, light-headedness and numbness.  Psychiatric/Behavioral: Negative for agitation.  All other systems reviewed and are negative.   Physical Exam Updated Vital Signs BP 140/61   Pulse 65   Temp 98.4 F (36.9 C) (Oral)   Resp 14   Ht 6' (1.829 m)   Wt 71.8 kg   SpO2 100%   BMI 21.47 kg/m   Physical Exam Vitals and nursing note reviewed.  Constitutional:      General: He is not in acute distress.    Appearance: He is well-developed. He is not ill-appearing, toxic-appearing or diaphoretic.  HENT:     Head: Normocephalic.     Nose: Nose normal. No congestion or rhinorrhea.     Mouth/Throat:     Mouth: Mucous membranes are moist.     Pharynx: No oropharyngeal exudate or posterior oropharyngeal erythema.  Eyes:     Extraocular Movements: Extraocular movements intact.     Conjunctiva/sclera: Conjunctivae normal.     Pupils: Pupils are equal, round, and reactive to light.  Cardiovascular:     Rate and Rhythm: Normal rate and regular rhythm.     Heart sounds: No murmur heard.   Pulmonary:     Effort: Pulmonary effort is normal. No respiratory distress.     Breath sounds: Normal breath sounds. No wheezing, rhonchi or rales.  Chest:     Chest wall: No tenderness.  Abdominal:     General: Abdomen  is flat.     Palpations: Abdomen is soft.     Tenderness: There is no abdominal tenderness. There is no right CVA tenderness, left CVA tenderness or guarding.  Musculoskeletal:        General: No tenderness.     Cervical back: Neck supple.     Right lower leg: No edema.     Left lower leg: No edema.  Skin:    General: Skin is warm and dry.     Capillary Refill: Capillary refill takes less than 2 seconds.     Findings: No erythema or rash.  Neurological:     General: No focal deficit present.     Mental Status: He is alert.     Sensory: No sensory  deficit.     Motor: No weakness.  Psychiatric:        Mood and Affect: Mood normal.     ED Results / Procedures / Treatments   Labs (all labs ordered are listed, but only abnormal results are displayed) Labs Reviewed  CBC WITH DIFFERENTIAL/PLATELET - Abnormal; Notable for the following components:      Result Value   RBC 3.07 (*)    Hemoglobin 9.7 (*)    HCT 29.3 (*)    All other components within normal limits  COMPREHENSIVE METABOLIC PANEL - Abnormal; Notable for the following components:   Sodium 134 (*)    Potassium 3.4 (*)    CO2 17 (*)    BUN 24 (*)    Creatinine, Ser 1.66 (*)    Total Protein 6.1 (*)    GFR calc non Af Amer 38 (*)    GFR calc Af Amer 44 (*)    Anion gap 16 (*)    All other components within normal limits  MAGNESIUM  PROTIME-INR  TSH  TROPONIN I (HIGH SENSITIVITY)  TROPONIN I (HIGH SENSITIVITY)    EKG EKG Interpretation  Date/Time:  Monday November 05 2019 20:23:44 EDT Ventricular Rate:  55 PR Interval:    QRS Duration: 153 QT Interval:  470 QTC Calculation: 450 R Axis:   -42 Text Interpretation: Sinus rhythm Atrial premature complex RBBB and LAFB Left ventricular hypertrophy No prior ECG for comparison. No STEMI Confirmed by Antony Blackbird [phone]) on 11/05/2019 8:30:52 PM   Radiology CT Head Wo Contrast  Result Date: 11/05/2019 CLINICAL DATA:  Syncope.  Struck head.  On Plavix. EXAM: CT HEAD WITHOUT CONTRAST TECHNIQUE: Contiguous axial images were obtained from the base of the skull through the vertex without intravenous contrast. COMPARISON:  Head CT 04/23/2018 FINDINGS: Brain: No intracranial hemorrhage, mass effect, or midline shift. Stable age related atrophy. Moderate chronic small vessel ischemia. Multiple chronic bilateral basal gangliar infarcts. No hydrocephalus. The basilar cisterns are patent. No evidence of territorial infarct or acute ischemia. No extra-axial or intracranial fluid collection. Vascular: Atherosclerosis of skullbase  vasculature without hyperdense vessel or abnormal calcification. Skull: No fracture or focal lesion. Sinuses/Orbits: Paranasal sinuses and mastoid air cells are clear. The visualized orbits are unremarkable. Other: None. IMPRESSION: 1. No acute intracranial abnormality. No skull fracture. 2. Stable age related atrophy and chronic small vessel ischemia. Multiple chronic basal gangliar infarcts. Electronically Signed   By: Keith Rake M.D.   On: 11/05/2019 21:11   CT Cervical Spine Wo Contrast  Result Date: 11/05/2019 CLINICAL DATA:  Syncope, fall.  On Plavix. EXAM: CT CERVICAL SPINE WITHOUT CONTRAST TECHNIQUE: Multidetector CT imaging of the cervical spine was performed without intravenous contrast. Multiplanar CT image reconstructions were also  generated. COMPARISON:  No dedicated cervical imaging. Neck CTA 07/01/2011 reviewed. FINDINGS: Alignment: No traumatic subluxation. Grade 1 anterolisthesis of C3 on C4, C4 on C5, and C7 on T1, chronic and degenerative. There is straightening of mid cervical lordosis. Skull base and vertebrae: Bony fusion of C5 through C7 may be degenerative or postsurgical. No acute fracture. The dens is intact. Skull base is intact. Modic endplate changes at P6-P9. Soft tissues and spinal canal: No prevertebral fluid or swelling. No visible canal hematoma. Disc levels: Complete disc space loss at C5-C6 and C6-C7, degenerative versus postsurgical. Additional disc space narrowing and endplate spurring at J0-D3 and C7-T1. There is moderate multilevel facet hypertrophy. Upper chest: Mild biapical pleuroparenchymal scarring. No acute findings. Other: Carotid calcifications. Right posterior neck lipoma unchanged. IMPRESSION: 1. No acute fracture or subluxation of the cervical spine. 2. Multilevel degenerative disc disease and facet hypertrophy. Bony fusion of C5 through C7 may be degenerative or postsurgical. Electronically Signed   By: Keith Rake M.D.   On: 11/05/2019 21:16   DG  Chest Portable 1 View  Result Date: 11/05/2019 CLINICAL DATA:  Status post fall. EXAM: PORTABLE CHEST 1 VIEW COMPARISON:  March 24, 2010 FINDINGS: There is no evidence of acute infiltrate, pleural effusion or pneumothorax. The heart size and mediastinal contours are within normal limits. There is moderate severity calcification of the thoracic aorta. A chronic appearing deformity is seen involving the distal right clavicle. The visualized skeletal structures are otherwise unremarkable. IMPRESSION: No active disease. Electronically Signed   By: Virgina Norfolk M.D.   On: 11/05/2019 20:43    Procedures Procedures (including critical care time)  Medications Ordered in ED Medications  sodium chloride 0.9 % bolus 1,000 mL (0 mLs Intravenous Stopped 11/05/19 2134)    ED Course  I have reviewed the triage vital signs and the nursing notes.  Pertinent labs & imaging results that were available during my care of the patient were reviewed by me and considered in my medical decision making (see chart for details).    MDM Rules/Calculators/A&P                          Philip Ward is a 79 y.o. male with a past medical history significant for prior stroke with chronic left face and left sided numbness who presents with a fall and syncope.  Patient reports that he thinks he had orthostatic syncope when he stood up and walk several feet and then got lightheaded and fell.  He did report hitting his head and neck on the wall and then the ground.  He does not think he was out for very long and family heard a crash but did not see the fall.  Patient denies any pain in his chest, abdomen, or pelvis.  He also denies any palpitations, chest pain, shortness of breath either preceding or after the episode.  He does report a headache and some neck pain.  He is on Plavix for the prior stroke.  He denies any recent fevers, chills, congestion, cough, URI symptoms, nausea vomiting, diarrhea, or urinary symptoms.   He reports he was feeling well before the episode.  He reports he has syncopized in the past for different reasons.  Patient is a retired Surveyor, [employer].  On exam, patient did have some tenderness in his paraspinal neck and the back of his head.  There were no laceration seen on nasal exam.  Lungs were clear and chest was nontender.  Abdomen was nontender.  No focal neurologic deficits on initial exam.  EKG did not show STEMI.     Clinically I do agree with the patient he likely orthostatic hypotension and syncopal episode.  I agree with getting imaging of the head and neck which is what the patient primarily wanted.  We did convince him to allow [place] to get some labs and chest x-ray and watch him on telemetry to make sure there are no other concerning causes for his lightheadedness and syncope.  They follows reassuring, anticipate rehydration and discharged home to follow-up with his PCP.  Patient agrees to this plan at this point.  11:18 PM Other laboratory testing was reassuring.  We did discuss that his creatinine was up compared to prior at 1.66.  Patient agrees he thinks he is slightly dehydrated and agrees he is feeling better after the fluids.  He agrees with not being admitted for the mild AKI and would rather push fluids at home and follow-up with his PCP.  He had mild anemia which he reports he chronically has.  No leukocytosis.  His troponin was negative x2.  He reports he does not feel lightheaded or orthostatic anymore after the fluids.  CT imaging was reassuring with old strokes but no new acute abnormalities or fractures.  C-spine did not show any fracture or dislocation just degenerative disease.  Collar was removed and patient is feeling much better.  Chest x-ray also was reassuring do not suspect fluid overload at this time.  I feel the patient is safe to go home as this is likely an orthostatic syncopal episode was patient agrees.  He will follow-up with his PCP and understood  return precautions.  He had no questions or concerns and was discharged in good condition after monitoring for over 3 hours with no further symptoms.    Final Clinical Impression(s) / ED Diagnoses Final diagnoses:  Syncope, unspecified syncope type  Injury of head, initial encounter  Dehydration     Clinical Impression: 1. Syncope, unspecified syncope type   2. Injury of head, initial encounter   3. Dehydration     Disposition: Discharge  Condition: Good  I have discussed the results, Dx and Tx plan with the pt(& family if present). He/she/they expressed understanding and agree(s) with the plan. Discharge instructions discussed at great length. Strict return precautions discussed and pt &/or family have verbalized understanding of the instructions. No further questions at time of discharge.    New Prescriptions   No medications on file    Follow Up: Shon Baton, [place] [address] [place] [address] [id number] mc [place]       Leland Staszewski, Gwenyth Allegra, MD 11/05/19 [phone]

## 2019-11-05 NOTE — Progress Notes (Signed)
Orthopedic Tech Progress Note Patient Details:  Philip Ward 03/30/1940 [id number] Level 2 trauma not needed. Patient ID: Philip Ward, male   DOB: 1941-02-09, 79 y.o.   MRN: [id number]   Philip Ward 11/05/2019, 8:39 PM

## 2019-11-05 NOTE — ED Triage Notes (Signed)
Pt presented to ED via Trauma level II fall. Pt taking Plavix, per EMS unwitnessed fall, spouse called EMS post hearing "thud" in other room. Per EMS pt has no memory of call, unable to recall hitting head, per EMS +LOC. No visible injuries or deformities, headache and neck pain, hx CVA and falls from inner ear. CBG 98, 18 LAC BP 140/62

## 2019-11-05 NOTE — Discharge Instructions (Signed)
Your work-up today was overall reassuring.  Your kidney function was slightly elevated likely related to dehydration as we discussed.  Please push hydration for the next few days and rest.  The imaging did not show any acute injuries or bleeding in your head and your neck showed degenerative disease.  Please follow-up with your PCP for this.  If any symptoms change or worsen, please return to the nearest emergency department.

## 2019-11-06 ENCOUNTER — Encounter (HOSPITAL_COMMUNITY): Payer: Self-pay

## 2021-12-08 ENCOUNTER — Ambulatory Visit: Payer: Medicare Other | Admitting: Internal Medicine

## 2021-12-08 ENCOUNTER — Encounter: Payer: Self-pay | Admitting: Internal Medicine

## 2021-12-08 VITALS — BP 120/34 | HR 63 | Temp 98.0°F | Resp 16 | Ht 72.0 in | Wt 151.2 lb

## 2021-12-08 DIAGNOSIS — I482 Chronic atrial fibrillation, unspecified: Secondary | ICD-10-CM

## 2021-12-08 DIAGNOSIS — Z7901 Long term (current) use of anticoagulants: Secondary | ICD-10-CM | POA: Insufficient documentation

## 2021-12-08 DIAGNOSIS — I10 Essential (primary) hypertension: Secondary | ICD-10-CM | POA: Insufficient documentation

## 2021-12-08 MED ORDER — APIXABAN 2.5 MG PO TABS: 2.5000 mg | ORAL_TABLET | Freq: Two times a day (BID) | ORAL | 6 refills | Status: DC

## 2021-12-08 MED ORDER — AMLODIPINE BESYLATE 2.5 MG PO TABS: 2.5000 mg | ORAL_TABLET | Freq: Every day | ORAL | 2 refills | Status: DC

## 2021-12-08 NOTE — Progress Notes (Signed)
Primary Physician/Referring:  Philip Baton, MD  Patient ID: Philip Ward, male    DOB: 1940/08/09, 81 y.o.   MRN: [id number]  No chief complaint on file.  HPI:    Philip Ward  is a 81 y.o. male with past medical history significant for Afib, HTN, and hx of CVA  who is here to establish care with cardiology. He was recently at his PCPs office and his heart rate was quite elevated at that time (130s) however he thinks it is because of severe tooth pain. He was placed on abx for this and it has helped a bit but he will need to have the tooth extracted by an oral surgeon. Discussed with patient that we need to lower his eliquis dose given age and kidney function. Also, amlodipine will be decreased to half of the current dose given low blood pressures. He has not had an echo in a very long time. Patient will schedule follow-up shortly after his tooth surgery to further assess rapid heart rates. Denies chest pain, shortness of breath, palpitations, diaphoresis, syncope, edema, PND, orthopnea.   Past Medical History:  Diagnosis Date   Arthritis    Atherosclerosis of aorta (HCC)    moderate per TEE   Bradycardia    chronic, asymptomatic   External carotid artery stenosis    LECA  moderate stenosis per duplex   History of bladder cancer urologist-  dr Philip Ward   History of colon polyps    hyperplastic   History of CVA with residual deficit neurologist-  dr Philip Ward   x2 CVA- ;    06-30-2011 left basal ganglia infarct;   10-26-2011  right thalamic infart , residual mild left face and hand   History of PSVT (paroxysmal supraventricular tachycardia) 08-02-2011    17 beats NSVT documented on event monitor (asymptomatic)   History of syncope    2013  felt to vasovagal response   Hypertension    Lumbar stenosis    Premature atrial contractions    Stroke [facility])    Past Surgical History:  Procedure Laterality Date   CYSTOSCOPY WITH BIOPSY N/A 02/18/2015   Procedure: CYSTOSCOPY WITH BLADDER  BIOPSY;  Surgeon: Irine Seal, MD;  Location: [facility];  Service: Urology;  Laterality: N/A;   CYSTOSTOMY W/ BLADDER BIOPSY  05-05-2010   REPAIR LACERATED CORNEA LEFT EYE  yrs ago   TEE WITHOUT CARDIOVERSION  10/28/2011   Procedure: TRANSESOPHAGEAL ECHOCARDIOGRAM (TEE);  Surgeon: Lelon Perla, MD;  [facility]: [facility] ENDOSCOPY;  Service: Cardiovascular;  Laterality: N/A;    normal LV,  moderate descending aorta atherosclerosis   TRANSTHORACIC ECHOCARDIOGRAM  06-29-2011   mild LVH,  ef 55-60%,  mild MR and TR   TRANSURETHRAL RESECTION OF BLADDER TUMOR WITH MITOMYCIN-C  03-24-2010   Family History  Problem Relation Age of Onset   Heart Problems Mother     Social History   Tobacco Use   Smoking status: Every Day    Types: Pipe   Smokeless tobacco: Never   Tobacco comments:    using chantix and trying to quit  Substance Use Topics   Alcohol use: Yes    Comment: social drinker   Marital Status: Married  ROS  Review of Systems  HENT:         +tooth pain radiating to jaw and left ear  Cardiovascular:  Positive for irregular heartbeat.  All other systems reviewed and are negative. Objective  Blood pressure (!) 120/34, pulse 63, temperature 98 F (  36.7 C), temperature source Temporal, resp. rate 16, height 6' (1.829 m), weight 151 lb 3.2 oz (68.6 kg), SpO2 93 %. Body mass index is 20.51 kg/m.     12/08/2021    1:23 PM 11/05/2019   11:30 PM 11/05/2019   10:46 PM  Vitals with BMI  Height '6\' 0"'$     Weight 151 lbs 3 oz    BMI 16.9    Systolic 678 938 101  Diastolic 34 62 56  Pulse 63 71 78     Physical Exam Vitals and nursing note reviewed.  Constitutional:      Appearance: Normal appearance.  Cardiovascular:     Rate and Rhythm: Normal rate. Rhythm irregular.     Pulses: Normal pulses.     Heart sounds: Murmur heard.  Pulmonary:     Effort: Pulmonary effort is normal.     Breath sounds: Normal breath sounds.  Abdominal:     General: Bowel sounds are normal.   Musculoskeletal:     Right lower leg: No edema.     Left lower leg: No edema.  Skin:    General: Skin is warm and dry.  Neurological:     Mental Status: He is alert.   Medications and allergies   Allergies  Allergen Reactions   Statins Swelling   Statins Anaphylaxis     Medication list after today's encounter   Current Outpatient Medications:    amLODipine (NORVASC) 5 MG tablet, Take 10 mg by mouth daily., Disp: , Rfl:    apixaban (ELIQUIS) 2.5 MG TABS tablet, Take 1 tablet (2.5 mg total) by mouth 2 (two) times daily., Disp: 60 tablet, Rfl: 6   fenofibrate 160 MG tablet, Take 160 mg by mouth daily., Disp: , Rfl:    Multiple Vitamin (MULITIVITAMIN WITH MINERALS) TABS, Take 1 tablet by mouth daily., Disp: , Rfl:    pantoprazole (PROTONIX) 40 MG tablet, Take 40 mg by mouth daily., Disp: , Rfl:    vitamin C (ASCORBIC ACID) 500 MG tablet, Take 500 mg by mouth daily., Disp: , Rfl:   Laboratory examination:   Lab Results  Component Value Date   NA 134 (L) 11/05/2019   K 3.4 (L) 11/05/2019   CO2 17 (L) 11/05/2019   GLUCOSE 95 11/05/2019   BUN 24 (H) 11/05/2019   CREATININE 1.66 (H) 11/05/2019   CALCIUM 9.1 11/05/2019   GFRNONAA 38 (L) 11/05/2019       Latest Ref Rng & Units 11/05/2019    8:25 PM 02/18/2015    7:54 AM 10/26/2011    5:59 PM  CMP  Glucose 70 - 99 mg/dL 95  C 97  118   BUN 8 - 23 mg/dL 24  C 16  12   Creatinine 0.61 - 1.24 mg/dL 1.66  C 1.20  0.87   Sodium 135 - 145 mmol/L 134  C 138  136   Potassium 3.5 - 5.1 mmol/L 3.4  C 4.2  3.8   Chloride 98 - 111 mmol/L 101  C 102  101   CO2 22 - 32 mmol/L 17  C  26   Calcium 8.9 - 10.3 mg/dL 9.1  C  8.6   Total Protein 6.5 - 8.1 g/dL 6.1  C    Total Bilirubin 0.3 - 1.2 mg/dL 0.6  C    Alkaline Phos 38 - 126 U/L 41  C    AST 15 - 41 U/L 22  C    ALT 0 - 44 U/L 16  C      C Corrected result      Latest Ref Rng & Units 11/05/2019    8:25 PM 02/18/2015    7:54 AM 10/26/2011    5:59 PM  CBC  WBC 4.0 - 10.5 K/uL  5.7   3.7   Hemoglobin 13.0 - 17.0 g/dL 9.7  12.9  10.4   Hematocrit 39.0 - 52.0 % 29.3  38.0  30.8   Platelets 150 - 400 K/uL 216   154     Lipid Panel No results for input(s): "CHOL", "TRIG", "[place]", "VLDL", "HDL", "CHOLHDL", "LDLDIRECT" in the last 8760 hours.  HEMOGLOBIN A1C Lab Results  Component Value Date   HGBA1C 6.2 (H) 10/27/2011   MPG 131 (H) 10/27/2011   TSH No results for input(s): "TSH" in the last 8760 hours.  External labs:     Radiology:    Cardiac Studies:   No results found for this or any previous visit from the past 1095 days.     No results found for this or any previous visit from the past 1095 days.     EKG:   12/01/21 EKG at Dr Keane Police office scanned into chart, AFIB RVR, today he is rate controlled    Assessment     ICD-10-CM   1. Essential hypertension  I10     2. Atrial fibrillation, chronic (HCC)  I48.20     3. Long term (current) use of anticoagulants  Z79.01        No orders of the defined types were placed in this encounter.   Meds ordered this encounter  Medications   apixaban (ELIQUIS) 2.5 MG TABS tablet    Sig: Take 1 tablet (2.5 mg total) by mouth 2 (two) times daily.    Dispense:  60 tablet    Refill:  6    Medications Discontinued During This Encounter  Medication Reason   amLODipine (NORVASC) 2.5 MG tablet    amLODipine (NORVASC) 10 MG tablet    pantoprazole (PROTONIX) 40 MG tablet    clopidogrel (PLAVIX) 75 MG tablet    hydrochlorothiazide (MICROZIDE) 12.5 MG capsule    fenofibrate 160 MG tablet    hydrochlorothiazide (HYDRODIURIL) 12.5 MG tablet    apixaban (ELIQUIS) 5 MG TABS tablet    Naproxen Sodium (ALEVE PO)      Recommendations:   Philip Ward is a 81 y.o.  M with HTN, Afib, and history of stroke  Continue current cardiac medications with the following changes. Amlodipine decreased to 2.'5mg'$  Eliquis decreased to 2.'5mg'$  BID given age and creatinine  Encourage low-sodium diet, less than  2000 mg daily. Schedule imaging tests in office - echocardiogram. Follow-up in 6 months or sooner if needed. Call and schedule visit after tooth extracted by oral surgery - ok to hold Eliquis for this as it is infected    Floydene Flock, DO, [facility]  12/08/2021, 1:35 PM Office: [phone] Pager: [phone]

## 2021-12-11 ENCOUNTER — Telehealth: Payer: Self-pay

## 2021-12-11 NOTE — Telephone Encounter (Signed)
Spoke with patient and patient scheduled an appointment to discuss results of echo .

## 2021-12-11 NOTE — Telephone Encounter (Signed)
Patient called requesting someone to return call LVM for patient to return call

## 2022-01-07 ENCOUNTER — Ambulatory Visit: Payer: Medicare Other

## 2022-01-07 DIAGNOSIS — Z7901 Long term (current) use of anticoagulants: Secondary | ICD-10-CM

## 2022-01-07 DIAGNOSIS — I10 Essential (primary) hypertension: Secondary | ICD-10-CM

## 2022-01-07 DIAGNOSIS — I482 Chronic atrial fibrillation, unspecified: Secondary | ICD-10-CM

## 2022-01-12 ENCOUNTER — Ambulatory Visit: Payer: Medicare Other | Admitting: Internal Medicine

## 2022-03-04 ENCOUNTER — Other Ambulatory Visit: Payer: Self-pay | Admitting: Internal Medicine

## 2022-05-24 ENCOUNTER — Ambulatory Visit: Payer: Medicare Other | Admitting: Internal Medicine

## 2022-05-26 ENCOUNTER — Ambulatory Visit: Payer: Medicare Other | Admitting: Internal Medicine

## 2022-06-08 ENCOUNTER — Ambulatory Visit: Payer: Medicare Other | Admitting: Internal Medicine

## 2022-06-28 ENCOUNTER — Ambulatory Visit: Payer: Medicare Other | Admitting: Internal Medicine

## 2022-06-28 ENCOUNTER — Encounter: Payer: Self-pay | Admitting: Internal Medicine

## 2022-06-28 VITALS — BP 118/80 | HR 104 | Resp 16 | Ht 72.0 in | Wt 147.6 lb

## 2022-06-28 DIAGNOSIS — I482 Chronic atrial fibrillation, unspecified: Secondary | ICD-10-CM

## 2022-06-28 DIAGNOSIS — I10 Essential (primary) hypertension: Secondary | ICD-10-CM

## 2022-06-28 DIAGNOSIS — Z7901 Long term (current) use of anticoagulants: Secondary | ICD-10-CM

## 2022-06-28 MED ORDER — METOPROLOL SUCCINATE ER 25 MG PO TB24: 25.0000 mg | ORAL_TABLET | Freq: Two times a day (BID) | ORAL | 3 refills | Status: AC

## 2022-06-28 NOTE — Progress Notes (Signed)
Primary Physician/Referring:  Creola Cornusso, John, MD  Patient ID: Philip Ward, male    DOB: 1940/11/10, 82 y.o.   MRN: [id number]  Chief Complaint  Patient presents with   Hypertension   Follow-up    6 months   HPI:    Philip Ward  is a [age] y.o. male with past medical history significant for Afib, HTN, and hx of CVA  who is here for a follow-up visit. He has been doing pretty well since the last time he was here. His heart rates are still somewhat erratic but controlled for the most part.  Denies chest pain, shortness of breath, palpitations, diaphoresis, syncope, edema, PND, orthopnea.   Past Medical History:  Diagnosis Date   Arthritis    Atherosclerosis of aorta    moderate per TEE   Bradycardia    chronic, asymptomatic   External carotid artery stenosis    LECA  moderate stenosis per duplex   History of bladder cancer urologist-  dr Annabell Howellswrenn   History of colon polyps    hyperplastic   History of CVA with residual deficit neurologist-  dr Pearlean Browniesethi   x2 CVA- ;    06-30-2011 left basal ganglia infarct;   10-26-2011  right thalamic infart , residual mild left face and hand   History of PSVT (paroxysmal supraventricular tachycardia) 08-02-2011    17 beats NSVT documented on event monitor (asymptomatic)   History of syncope    2013  felt to vasovagal response   Hypertension    Lumbar stenosis    Premature atrial contractions    Stroke    Past Surgical History:  Procedure Laterality Date   CYSTOSCOPY WITH BIOPSY N/A 02/18/2015   Procedure: CYSTOSCOPY WITH BLADDER BIOPSY;  Surgeon: Bjorn PippinJohn Wrenn, MD;  [facility];  Service: Urology;  Laterality: N/A;   CYSTOSTOMY W/ BLADDER BIOPSY  05-05-2010   REPAIR LACERATED CORNEA LEFT EYE  yrs ago   TEE WITHOUT CARDIOVERSION  10/28/2011   Procedure: TRANSESOPHAGEAL ECHOCARDIOGRAM (TEE);  Surgeon: Lewayne BuntingBrian S Crenshaw, MD;  [facility];  Service: Cardiovascular;  Laterality: N/A;    normal LV,  moderate  descending aorta atherosclerosis   TRANSTHORACIC ECHOCARDIOGRAM  06-29-2011   mild LVH,  ef 55-60%,  mild MR and TR   TRANSURETHRAL RESECTION OF BLADDER TUMOR WITH MITOMYCIN-C  03-24-2010   Family History  Problem Relation Age of Onset   Heart Problems Mother     Social History   Tobacco Use   Smoking status: Every Day    Types: Pipe   Smokeless tobacco: Never   Tobacco comments:    using chantix and trying to quit  Substance Use Topics   Alcohol use: Yes    Comment: social drinker   Marital Status: Married  ROS  Review of Systems  Cardiovascular:  Positive for irregular heartbeat.  All other systems reviewed and are negative.  Objective  Blood pressure 118/80, pulse (!) 104, resp. rate 16, height 6' (1.829 m), weight 147 lb 9.6 oz (67 kg). Body mass index is 20.02 kg/m.     06/28/2022   10:33 AM 12/08/2021    1:23 PM 11/05/2019   11:30 PM  Vitals with BMI  Height 6\' 0"  6\' 0"    Weight 147 lbs 10 oz 151 lbs 3 oz   BMI 20.01 20.5   Systolic 118 120 433122  Diastolic 80 34 62  Pulse 104 63 71     Physical Exam Vitals and nursing note reviewed.  Constitutional:      Appearance: Normal appearance.  Cardiovascular:     Rate and Rhythm: Normal rate. Rhythm irregular.     Pulses: Normal pulses.     Heart sounds: Murmur heard.  Pulmonary:     Effort: Pulmonary effort is normal.     Breath sounds: Normal breath sounds.  Abdominal:     General: Bowel sounds are normal.  Musculoskeletal:     Right lower leg: No edema.     Left lower leg: No edema.  Skin:    General: Skin is warm and dry.  Neurological:     Mental Status: He is alert.    Medications and allergies   Allergies  Allergen Reactions   Statins Swelling   Statins Anaphylaxis     Medication list after today's encounter   Current Outpatient Medications:    apixaban (ELIQUIS) 2.5 MG TABS tablet, Take 1 tablet (2.5 mg total) by mouth 2 (two) times daily., Disp: 60 tablet, Rfl: 6   fenofibrate 160 MG  tablet, Take 160 mg by mouth daily., Disp: , Rfl:    metoprolol succinate (TOPROL XL) 25 MG 24 hr tablet, Take 1 tablet (25 mg total) by mouth in the morning and at bedtime., Disp: 180 tablet, Rfl: 3   Multiple Vitamin (MULITIVITAMIN WITH MINERALS) TABS, Take 1 tablet by mouth daily., Disp: , Rfl:    pantoprazole (PROTONIX) 40 MG tablet, Take 40 mg by mouth daily., Disp: , Rfl:    vitamin C (ASCORBIC ACID) 500 MG tablet, Take 500 mg by mouth daily., Disp: , Rfl:   Laboratory examination:   Lab Results  Component Value Date   NA 134 (L) 11/05/2019   K 3.4 (L) 11/05/2019   CO2 17 (L) 11/05/2019   GLUCOSE 95 11/05/2019   BUN 24 (H) 11/05/2019   CREATININE 1.66 (H) 11/05/2019   CALCIUM 9.1 11/05/2019   GFRNONAA 38 (L) 11/05/2019       Latest Ref Rng & Units 11/05/2019    8:25 PM 02/18/2015    7:54 AM 10/26/2011    5:59 PM  CMP  Glucose 70 - 99 mg/dL 95  C 97  876   BUN 8 - 23 mg/dL 24  C 16  12   Creatinine 0.61 - 1.24 mg/dL 8.11  C 5.72  6.20   Sodium 135 - 145 mmol/L 134  C 138  136   Potassium 3.5 - 5.1 mmol/L 3.4  C 4.2  3.8   Chloride 98 - 111 mmol/L 101  C 102  101   CO2 22 - 32 mmol/L 17  C  26   Calcium 8.9 - 10.3 mg/dL 9.1  C  8.6   Total Protein 6.5 - 8.1 g/dL 6.1  C    Total Bilirubin 0.3 - 1.2 mg/dL 0.6  C    Alkaline Phos 38 - 126 U/L 41  C    AST 15 - 41 U/L 22  C    ALT 0 - 44 U/L 16  C      C Corrected result      Latest Ref Rng & Units 11/05/2019    8:25 PM 02/18/2015    7:54 AM 10/26/2011    5:59 PM  CBC  WBC 4.0 - 10.5 K/uL 5.7   3.7   Hemoglobin 13.0 - 17.0 g/dL 9.7  35.5  97.4   Hematocrit 39.0 - 52.0 % 29.3  38.0  30.8   Platelets 150 - 400 K/uL 216   154  Lipid Panel No results for input(s): "CHOL", "TRIG", "LDLCALC", "VLDL", "HDL", "CHOLHDL", "LDLDIRECT" in the last 8760 hours.  HEMOGLOBIN A1C Lab Results  Component Value Date   HGBA1C 6.2 (H) 10/27/2011   MPG 131 (H) 10/27/2011   TSH No results for input(s): "TSH" in the last 8760  hours.  External labs:     Radiology:    Cardiac Studies:   Echocardiogram 01/07/2022: Mildly depressed LV systolic function with visual EF 40-45%. Left ventricle cavity is normal in size. Severe concentric hypertrophy of the left ventricle. Hypokinetic global wall motion. Indeterminate diastolic filling pattern, elevated LAP. Calculated EF 45%. Left atrial cavity is moderately dilated at 4.2 cm. Right atrial cavity is mild to moderately dilated. Right ventricle cavity is mildly dilated. Normal right ventricular function. Trileaflet aortic valve. Mild aortic valve leaflet calcification. Moderate to severe mitral regurgitation. Mild mitral valve leaflet thickening. Structurally normal tricuspid valve.  Severe tricuspid regurgitation. Mild pulmonary hypertension. RVSP measures 31 mmHg.    EKG:   12/01/21 EKG at Dr Ferd Hibbs office scanned into chart, AFIB RVR, today he is rate controlled    Assessment     ICD-10-CM   1. Essential hypertension  I10 EKG 12-Lead    2. Atrial fibrillation, chronic  I48.20 PCV ECHOCARDIOGRAM COMPLETE    3. Long term (current) use of anticoagulants  Z79.01        Orders Placed This Encounter  Procedures   EKG 12-Lead   PCV ECHOCARDIOGRAM COMPLETE    Standing Status:   Future    Standing Expiration Date:   06/28/2023    Meds ordered this encounter  Medications   metoprolol succinate (TOPROL XL) 25 MG 24 hr tablet    Sig: Take 1 tablet (25 mg total) by mouth in the morning and at bedtime.    Dispense:  180 tablet    Refill:  3    Medications Discontinued During This Encounter  Medication Reason   amLODipine (NORVASC) 2.5 MG tablet       Recommendations:   Philip Ward is a 82 y.o.  M with HTN, Afib, and history of stroke  Essential hypertension Encourage low-sodium diet, less than 2000 mg daily. Will stop amlodipine and start Toprol for rate control   Atrial fibrillation, chronic Long term (current) use of  anticoagulants LVEF mildly reduced 45% suspect due to tachycardia, will repeat in a few months once HR better controlled Eliquis decreased to 2.5mg  BID given age and creatinine at our last visit Schedule imaging tests in office - echocardiogram prior to next follow-up visit. Follow-up in 6 months or sooner if needed.     Clotilde Dieter, DO, [facility]  06/28/2022, 10:40 AM Office: [phone] Pager: [phone]

## 2022-07-29 ENCOUNTER — Other Ambulatory Visit: Payer: Medicare Other

## 2022-08-20 ENCOUNTER — Other Ambulatory Visit: Payer: Medicare Other

## 2022-09-02 ENCOUNTER — Other Ambulatory Visit: Payer: Self-pay | Admitting: Internal Medicine

## 2022-09-13 ENCOUNTER — Ambulatory Visit: Payer: Medicare Other

## 2022-09-13 DIAGNOSIS — I482 Chronic atrial fibrillation, unspecified: Secondary | ICD-10-CM

## 2022-12-23 ENCOUNTER — Telehealth: Payer: Self-pay | Admitting: Cardiology

## 2022-12-23 NOTE — Telephone Encounter (Signed)
Attempted to call the pt and the phone rang several times and no answer. Will try her again later today.

## 2022-12-23 NOTE — Telephone Encounter (Signed)
Pt spouse called in stating [facility] messed her and her husbands medications and she wants a nurse to call her and tell her which ones her husband should be taking.

## 2022-12-24 NOTE — Telephone Encounter (Signed)
Called patient's wife about message. She actually has questions about her medications, and not her husband's medications. Will close out note.

## 2022-12-24 NOTE — Telephone Encounter (Signed)
Patient's wife is returning call. 

## 2022-12-29 ENCOUNTER — Ambulatory Visit: Payer: Medicare Other | Admitting: Cardiology

## 2023-01-25 ENCOUNTER — Other Ambulatory Visit: Payer: Self-pay | Admitting: Internal Medicine

## 2023-01-26 ENCOUNTER — Other Ambulatory Visit: Payer: Self-pay | Admitting: Cardiology

## 2023-01-26 DIAGNOSIS — I482 Chronic atrial fibrillation, unspecified: Secondary | ICD-10-CM

## 2023-01-26 MED ORDER — APIXABAN 2.5 MG PO TABS: 2.5000 mg | ORAL_TABLET | Freq: Two times a day (BID) | ORAL | 5 refills | Status: DC

## 2023-01-26 NOTE — Telephone Encounter (Addendum)
Eliquis 2.5mg  refill request received. Patient is 82 years old, weight-67kg, Crea-1.66 on 11/05/2019-NEEDS LABS, Diagnosis-Afib, and last seen by Jacquelin Hawking, DO on 06/28/22. Dose is appropriate based on dosing criteria. Jeanene Erb PCP office Dr Ferd Hibbs, and had to leave a message   Labs received from PCP office Crea 1.8 on 11/04/22, will send in refill to requested pharmacy.

## 2023-01-26 NOTE — Telephone Encounter (Signed)
*  STAT* If patient is at the pharmacy, call can be transferred to refill team.   1. Which medications need to be refilled? (please list name of each medication and dose if known)  apixaban (ELIQUIS) 2.5 MG TABS tablet    2. Which pharmacy/location (including street and city if local pharmacy) is medication to be sent to?  [id number] [place], [place] - [address] AT [facility] [address] Phone: [phone]  Fax: [phone]     3. Do they need a 30 day or 90 day supply? 30  Only has 3 left

## 2023-02-01 ENCOUNTER — Encounter: Payer: Self-pay | Admitting: Cardiology

## 2023-02-01 ENCOUNTER — Ambulatory Visit: Payer: Medicare Other | Attending: Cardiology | Admitting: Cardiology

## 2023-02-01 VITALS — BP 120/60 | HR 33 | Resp 16 | Ht 72.0 in | Wt 145.0 lb

## 2023-02-01 DIAGNOSIS — Z13220 Encounter for screening for lipoid disorders: Secondary | ICD-10-CM | POA: Insufficient documentation

## 2023-02-01 DIAGNOSIS — I10 Essential (primary) hypertension: Secondary | ICD-10-CM | POA: Diagnosis not present

## 2023-02-01 DIAGNOSIS — I4811 Longstanding persistent atrial fibrillation: Secondary | ICD-10-CM

## 2023-02-01 NOTE — Patient Instructions (Signed)
Medication Instructions:   Your physician recommends that you continue on your current medications as directed. Please refer to the Current Medication list given to you today.  *If you need a refill on your cardiac medications before your next appointment, please call your pharmacy*   Lab Work:  TODAY--LIPIDS AND BMET  If you have labs (blood work) drawn today and your tests are completely normal, you will receive your results only by: MyChart Message (if you have MyChart) OR A paper copy in the mail If you have any lab test that is abnormal or we need to change your treatment, we will call you to review the results.    Follow-Up: At [facility], you and your health needs are our priority.  As part of our continuing mission to provide you with exceptional heart care, we have created designated Provider Care Teams.  These Care Teams include your primary Cardiologist (physician) and Advanced Practice Providers (APPs -  Physician Assistants and Nurse Practitioners) who all work together to provide you with the care you need, when you need it.  We recommend signing up for the patient portal called "MyChart".  Sign up information is provided on this After Visit Summary.  MyChart is used to connect with patients for Virtual Visits (Telemedicine).  Patients are able to view lab/test results, encounter notes, upcoming appointments, etc.  Non-urgent messages can be sent to your provider as well.   To learn more about what you can do with MyChart, go to [email or web address].    Your next appointment:   1 year(s)  Provider:   DR. Rosemary Holms

## 2023-02-01 NOTE — Progress Notes (Signed)
Cardiology Office Note:  .   Date:  02/01/2023  ID:  Philip Ward, DOB 11-29-1940, MRN [id number] PCP: Philip Corn, MD  [facility] Providers Cardiologist:  Philip Mainland, MD PCP: Philip Corn, MD  Chief Complaint  Patient presents with   Essential hypertension   Follow-up    6 month      History of Present Illness: .    Philip Ward is a 82 y.o. male with hypertension, persistent A-fib, h/o stroke  Patient is here today with his wife.  He denies any chest pain, more than usual exertional dyspnea.  He also denies any claudication symptoms.  However, he says that " his legs do not work as they used to".  On asking more specific questions, both patient and wife report that he seems to drag both his feet.  He is walking.  Wife also makes a comment that he "walks like President Biden".  He denies any sudden loss of strength, sensation, vision loss, slurred speech etc.  Vitals:   02/01/23 1044  BP: 120/60  Pulse: (!) 33  Resp: 16  SpO2: 97%  Erroneously counted as 33 bpm by the monitor. EKG shows ventricular rate of 90 bpm   ROS:  Review of Systems  Cardiovascular:  Negative for chest pain, dyspnea on exertion, leg swelling, palpitations and syncope.  Neurological:        Gait difficulty as per HPI     Studies Reviewed: Marland Kitchen       EKG 02/01/2023: Atrial fibrillation with premature ventricular or aberrantly conducted complexes Ventricular rate 90 bpm Right bundle branch block Left anterior fascicular block Bifascicular block When compared with ECG of 01-Feb-2023 10:42, No significant change was found  Echocardiogram 09/13/2022:  Left ventricle cavity is normal in size. Moderate concentric hypertrophy  of the left ventricle. Normal global wall motion. Normal LV systolic  function with EF 55%. Unable to evaluate diastolic function due to atrial  fibrillation.  Left atrial cavity is moderately dilated.  Right atrial cavity is moderately dilated.   Mild to moderate mitral regurgitation.  Mild tricuspid regurgitation. Estimated pulmonary artery systolic pressure  34 mmHg.  Compared to previous study in 2019, biatrial dilatation is new.      Risk Assessment/Calculations:    CHA2DS2-VASc Score = 5   This indicates a 7.2 % annual risk of stroke. The patient's score is based upon:        Physical Exam:   Physical Exam Vitals and nursing note reviewed.  Constitutional:      General: He is not in acute distress. Neck:     Vascular: No JVD.  Cardiovascular:     Rate and Rhythm: Normal rate. Rhythm irregular.     Heart sounds: Normal heart sounds. No murmur heard. Pulmonary:     Effort: Pulmonary effort is normal.     Breath sounds: Normal breath sounds. No wheezing or rales.  Musculoskeletal:     Right lower leg: No edema.     Left lower leg: No edema.      VISIT DIAGNOSES:   ICD-10-CM   1. Essential hypertension  I10 EKG 12-Lead    Basic metabolic panel    Lipid Profile    2. Longstanding persistent atrial fibrillation (HCC)  I48.11 Basic metabolic panel    Lipid Profile    3. Lipid screening  Z13.220 Basic metabolic panel    Lipid Profile       ASSESSMENT AND PLAN: .    Philip Ward  is a 82 y.o. male with hypertension, persistent A-fib, h/o stroke  Hypertension: Controlled.  No changes made today.  Persistent A-fib: He is currently on Eliquis 2.5 mg twice daily.  Age >80 years, weight >60 kg, creatinine previously >1.5. Check BMP today to confirm his Eliquis dose should still be 2.5 mg twice daily.  Risk stratification: He is not on a statin due to h/o angioedema.  Will check lipid panel.  If elevated, will check CT cardiac scoring scan for risk stratification.  Gait difficulty: No focal neurological signs on my exam.  However, patient seems to be having issues with gait and strength while walking.  He has upcoming follow-up with his PCP Dr. Timothy Ward.  I will defer to Dr. Timothy Ward whether patient  will need any physical therapy referral etc.    F/u in 1 year  Signed, Elder Negus, MD

## 2023-02-02 LAB — LIPID PANEL
Chol/HDL Ratio: 2.5 ratio (ref 0.0–5.0)
Cholesterol, Total: 164 mg/dL (ref 100–199)
HDL: 66 mg/dL (ref >39)
LDL Chol Calc (NIH): 87 mg/dL (ref 0–99)
Triglycerides: 55 mg/dL (ref 0–149)
VLDL Cholesterol Cal: 11 mg/dL (ref 5–40)

## 2023-02-02 LAB — BASIC METABOLIC PANEL
BUN/Creatinine Ratio: 17 (ref 10–24)
BUN: 30 mg/dL — ABNORMAL HIGH (ref 8–27)
CO2: 24 mmol/L (ref 20–29)
Calcium: 9.2 mg/dL (ref 8.6–10.2)
Chloride: 101 mmol/L (ref 96–106)
Creatinine, Ser: 1.78 mg/dL — ABNORMAL HIGH (ref 0.76–1.27)
Glucose: 82 mg/dL (ref 70–99)
Potassium: 4 mmol/L (ref 3.5–5.2)
Sodium: 138 mmol/L (ref 134–144)
eGFR: 37 mL/min/{1.73_m2} — ABNORMAL LOW (ref >59)

## 2023-06-05 ENCOUNTER — Other Ambulatory Visit: Payer: Self-pay

## 2023-06-05 ENCOUNTER — Emergency Department (HOSPITAL_COMMUNITY)

## 2023-06-05 ENCOUNTER — Emergency Department (HOSPITAL_COMMUNITY)
Admission: EM | Admit: 2023-06-05 | Discharge: 2023-06-05 | Disposition: A | Discharge status: Home / Self Care | Attending: Emergency Medicine | Admitting: Emergency Medicine

## 2023-06-05 ENCOUNTER — Encounter (HOSPITAL_COMMUNITY): Payer: Self-pay

## 2023-06-05 DIAGNOSIS — R4701 Aphasia: Secondary | ICD-10-CM | POA: Insufficient documentation

## 2023-06-05 DIAGNOSIS — Z79899 Other long term (current) drug therapy: Secondary | ICD-10-CM | POA: Diagnosis not present

## 2023-06-05 DIAGNOSIS — Z8673 Personal history of transient ischemic attack (TIA), and cerebral infarction without residual deficits: Secondary | ICD-10-CM | POA: Diagnosis not present

## 2023-06-05 DIAGNOSIS — Z7901 Long term (current) use of anticoagulants: Secondary | ICD-10-CM | POA: Insufficient documentation

## 2023-06-05 DIAGNOSIS — I10 Essential (primary) hypertension: Secondary | ICD-10-CM | POA: Insufficient documentation

## 2023-06-05 DIAGNOSIS — R410 Disorientation, unspecified: Secondary | ICD-10-CM | POA: Diagnosis not present

## 2023-06-05 LAB — ETHANOL: Alcohol, Ethyl (B): <10 mg/dL (ref <10)

## 2023-06-05 LAB — COMPREHENSIVE METABOLIC PANEL
ALT: 13 U/L (ref 0–44)
AST: 22 U/L (ref 15–41)
Albumin: 3.1 g/dL — ABNORMAL LOW (ref 3.5–5.0)
Alkaline Phosphatase: 48 U/L (ref 38–126)
Anion gap: 13 (ref 5–15)
BUN: 33 mg/dL — ABNORMAL HIGH (ref 8–23)
CO2: 20 mmol/L — ABNORMAL LOW (ref 22–32)
Calcium: 8.9 mg/dL (ref 8.9–10.3)
Chloride: 106 mmol/L (ref 98–111)
Creatinine, Ser: 1.7 mg/dL — ABNORMAL HIGH (ref 0.61–1.24)
GFR, Estimated: 40 mL/min — ABNORMAL LOW (ref >60)
Glucose, Bld: 119 mg/dL — ABNORMAL HIGH (ref 70–99)
Potassium: 4.3 mmol/L (ref 3.5–5.1)
Sodium: 139 mmol/L (ref 135–145)
Total Bilirubin: 0.7 mg/dL (ref 0.0–1.2)
Total Protein: 6 g/dL — ABNORMAL LOW (ref 6.5–8.1)

## 2023-06-05 LAB — DIFFERENTIAL
Abs Immature Granulocytes: 0.04 10*3/uL (ref 0.00–0.07)
Basophils Absolute: 0 10*3/uL (ref 0.0–0.1)
Basophils Relative: 1 %
Eosinophils Absolute: 0.1 10*3/uL (ref 0.0–0.5)
Eosinophils Relative: 2 %
Immature Granulocytes: 1 %
Lymphocytes Relative: 20 %
Lymphs Abs: 1.1 10*3/uL (ref 0.7–4.0)
Monocytes Absolute: 0.7 10*3/uL (ref 0.1–1.0)
Monocytes Relative: 13 %
Neutro Abs: 3.4 10*3/uL (ref 1.7–7.7)
Neutrophils Relative %: 63 %

## 2023-06-05 LAB — I-STAT CHEM 8, ED
BUN: 32 mg/dL — ABNORMAL HIGH (ref 8–23)
Calcium, Ion: 1.17 mmol/L (ref 1.15–1.40)
Chloride: 106 mmol/L (ref 98–111)
Creatinine, Ser: 1.8 mg/dL — ABNORMAL HIGH (ref 0.61–1.24)
Glucose, Bld: 110 mg/dL — ABNORMAL HIGH (ref 70–99)
HCT: 30 % — ABNORMAL LOW (ref 39.0–52.0)
Hemoglobin: 10.2 g/dL — ABNORMAL LOW (ref 13.0–17.0)
Potassium: 4.2 mmol/L (ref 3.5–5.1)
Sodium: 138 mmol/L (ref 135–145)
TCO2: 24 mmol/L (ref 22–32)

## 2023-06-05 LAB — URINALYSIS, W/ REFLEX TO CULTURE (INFECTION SUSPECTED)
Bilirubin Urine: NEGATIVE
Glucose, UA: NEGATIVE mg/dL
Ketones, ur: NEGATIVE mg/dL
Nitrite: NEGATIVE
Protein, ur: NEGATIVE mg/dL
Specific Gravity, Urine: 1.015 (ref 1.005–1.030)
pH: 6.5 (ref 5.0–8.0)

## 2023-06-05 LAB — CBC
HCT: 29.7 % — ABNORMAL LOW (ref 39.0–52.0)
Hemoglobin: 9.6 g/dL — ABNORMAL LOW (ref 13.0–17.0)
MCH: 30.6 pg (ref 26.0–34.0)
MCHC: 32.3 g/dL (ref 30.0–36.0)
MCV: 94.6 fL (ref 80.0–100.0)
Platelets: 175 10*3/uL (ref 150–400)
RBC: 3.14 MIL/uL — ABNORMAL LOW (ref 4.22–5.81)
RDW: 14.1 % (ref 11.5–15.5)
WBC: 5.3 10*3/uL (ref 4.0–10.5)
nRBC: 0 % (ref 0.0–0.2)

## 2023-06-05 LAB — APTT: aPTT: 35 s (ref 24–36)

## 2023-06-05 LAB — PROTIME-INR
INR: 1.3 — ABNORMAL HIGH (ref 0.8–1.2)
Prothrombin Time: 16.5 s — ABNORMAL HIGH (ref 11.4–15.2)

## 2023-06-05 MED ORDER — LORAZEPAM 2 MG/ML IJ SOLN
0.5000 mg | Freq: Once | INTRAMUSCULAR | Status: DC
  Filled 2023-06-05: qty 1

## 2023-06-05 MED ORDER — IOHEXOL 350 MG/ML SOLN
75.0000 mL | Freq: Once | INTRAVENOUS | Status: AC | PRN
  Administered 2023-06-05: 75 mL via INTRAVENOUS

## 2023-06-05 NOTE — ED Provider Notes (Cosign Needed Addendum)
  EMERGENCY DEPARTMENT AT [facility] Provider Note   CSN: [id number] Arrival date & time: 06/05/23  1600    History  Chief Complaint  Patient presents with   Altered Mental Status    Philip Ward is a 83 y.o. male who awoke this morning with slight aphasia, per wife.  She states that he had intermittent aphasia that would resolve spontaneously and recur.  Patient states that he felt like he was having trouble verbalizing the words.  Patient took a nap at approximately 1330 and awoke at 1400 with confusion.  When wife asked him his name, he stated his brother's name.  He has never had the symptoms before.  Initial blood pressure at that time was 130s.  While I was in the room, patient had intermittent aphasia that wife endorses was similar to this morning's episode. Daughter in [place], at beside, is a prior Charity fundraiser at [employer]. 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   PMHx hypertension, persistent A-fib, h/o stroke on twice daily Eliquis (with some potential missed nighttime doses)  Altered Mental Status      Home Medications Prior to Admission medications   Medication Sig Start Date End Date Taking? Authorizing Provider  apixaban (ELIQUIS) 2.5 MG TABS tablet Take 1 tablet (2.5 mg total) by mouth 2 (two) times daily. 01/26/23   Patwardhan, Anabel Bene, MD  fenofibrate 160 MG tablet Take 160 mg by mouth daily. 10/05/19   [provider]  metoprolol succinate (TOPROL XL) 25 MG 24 hr tablet Take 1 tablet (25 mg total) by mouth in the morning and at bedtime. 06/28/22   Custovic, Rozell Searing, DO  Multiple Vitamin (MULITIVITAMIN WITH MINERALS) TABS Take 1 tablet by mouth daily.    [provider]  pantoprazole (PROTONIX) 40 MG tablet Take 40 mg by mouth daily. 10/05/19   [provider]  vitamin C (ASCORBIC ACID) 500 MG tablet Take 500 mg by mouth daily.    [provider]      Allergies    Statins and Statins    Review of Systems   Review of  Systems  Physical Exam Updated Vital Signs BP (!) 166/111   Pulse (!) 106   Temp 98.3 F (36.8 C) (Oral)   Resp 12   SpO2 100%  Physical Exam Vitals and nursing note reviewed.  Constitutional:      General: He is not in acute distress.    Appearance: Normal appearance.  HENT:     Head: Normocephalic and atraumatic.     Mouth/Throat:     Lips: Pink. No lesions.     Mouth: Mucous membranes are moist.     Tongue: Tongue does not deviate from midline.     Pharynx: Oropharynx is clear. Uvula midline.  Eyes:     Extraocular Movements: Extraocular movements intact.     Conjunctiva/sclera: Conjunctivae normal.     Pupils: Pupils are equal, round, and reactive to light.  Cardiovascular:     Rate and Rhythm: Normal rate. Rhythm irregularly irregular.     Pulses:          Radial pulses are 2+ on the right side and 2+ on the left side.  Pulmonary:     Effort: Pulmonary effort is normal.     Breath sounds: Normal breath sounds. No wheezing or rhonchi.  Abdominal:     General: Abdomen is flat.     Palpations: Abdomen is soft.     Tenderness: There is no abdominal  tenderness.  Musculoskeletal:     Right lower leg: No edema.     Left lower leg: No edema.  Skin:    General: Skin is warm and dry.     Capillary Refill: Capillary refill takes less than 2 seconds.  Neurological:     Mental Status: He is alert.     GCS: GCS eye subscore is 4. GCS verbal subscore is 5. GCS motor subscore is 6.     Cranial Nerves: Cranial nerves 2-12 are intact. No facial asymmetry.     Sensory: Sensation is intact. No sensory deficit.     Motor: Motor function is intact. No weakness or pronator drift.     Comments: Weakness of right shoulder 2/2 pain from prior fxr. Inter  Psychiatric:        Behavior: Behavior is cooperative.     ED Results / Procedures / Treatments   Labs (all labs ordered are listed, but only abnormal results are displayed) Labs Reviewed  PROTIME-INR - Abnormal; Notable for the  following components:      Result Value   Prothrombin Time 16.5 (*)    INR 1.3 (*)    All other components within normal limits  CBC - Abnormal; Notable for the following components:   RBC 3.14 (*)    Hemoglobin 9.6 (*)    HCT 29.7 (*)    All other components within normal limits  COMPREHENSIVE METABOLIC PANEL - Abnormal; Notable for the following components:   CO2 20 (*)    Glucose, Bld 119 (*)    BUN 33 (*)    Creatinine, Ser 1.70 (*)    Total Protein 6.0 (*)    Albumin 3.1 (*)    GFR, Estimated 40 (*)    All other components within normal limits  URINALYSIS, W/ REFLEX TO CULTURE (INFECTION SUSPECTED) - Abnormal; Notable for the following components:   Hgb urine dipstick TRACE (*)    Leukocytes,Ua SMALL (*)    Bacteria, UA RARE (*)    All other components within normal limits  I-STAT CHEM 8, ED - Abnormal; Notable for the following components:   BUN 32 (*)    Creatinine, Ser 1.80 (*)    Glucose, Bld 110 (*)    Hemoglobin 10.2 (*)    HCT 30.0 (*)    All other components within normal limits  APTT  DIFFERENTIAL  ETHANOL  CBG MONITORING, ED   EKG EKG Interpretation Date/Time:  Sunday June 05 2023 16:09:42 EDT Ventricular Rate:  76 PR Interval:    QRS Duration:  141 QT Interval:  411 QTC Calculation: 463 R Axis:   -54  Text Interpretation: Atrial fibrillation RBBB and LAFB Confirmed by Pricilla Loveless [phone]) on 06/05/2023 4:29:53 PM  Radiology MR BRAIN WO CONTRAST Result Date: 06/05/2023 CLINICAL DATA:  Acute neurologic deficit EXAM: MRI HEAD WITHOUT CONTRAST TECHNIQUE: Multiplanar, multiecho pulse sequences of the brain and surrounding structures were obtained without intravenous contrast. COMPARISON:  None Available. FINDINGS: Brain: No acute infarct, mass effect or extra-axial collection. No acute or chronic hemorrhage. There is confluent hyperintense T2-weighted signal within the white matter. Generalized volume loss. Old small vessel infarcts of the basal ganglia  and thalami. The midline structures are normal. Vascular: Normal flow voids. Skull and upper cervical spine: Normal calvarium and skull base. Visualized upper cervical spine and soft tissues are normal. Sinuses/Orbits:No paranasal sinus fluid levels or advanced mucosal thickening. No mastoid or middle ear effusion. Normal orbits. IMPRESSION: 1. No acute intracranial abnormality. 2. Old  small vessel infarcts of the basal ganglia and thalami. 3. Findings of chronic small vessel ischemia and volume loss. Electronically Signed   By: Deatra Robinson M.D.   On: 06/05/2023 23:03   CT ANGIO HEAD NECK W WO CM Result Date: 06/05/2023 CLINICAL DATA:  Neuro deficit, acute, stroke suspected. Determine embolic source. EXAM: CT ANGIOGRAPHY HEAD AND NECK WITH AND WITHOUT CONTRAST TECHNIQUE: Multidetector CT imaging of the head and neck was performed using the standard protocol during bolus administration of intravenous contrast. Multiplanar CT image reconstructions and MIPs were obtained to evaluate the vascular anatomy. Carotid stenosis measurements (when applicable) are obtained utilizing NASCET criteria, using the distal internal carotid diameter as the denominator. RADIATION DOSE REDUCTION: This exam was performed according to the departmental dose-optimization program which includes automated exposure control, adjustment of the mA and/or kV according to patient size and/or use of iterative reconstruction technique. CONTRAST:  75mL OMNIPAQUE IOHEXOL 350 MG/ML SOLN COMPARISON:  Head CT same day. FINDINGS: CTA NECK FINDINGS Aortic arch: Aortic atherosclerosis. Branching pattern is normal without flow limiting origin stenosis. Right carotid system: Common carotid artery shows atherosclerotic plaque at its origin with stenosis of 50%. Additional plaque along the course of the common carotid artery is present with an additional 50% stenosis. At the carotid bifurcation, there is calcified plaque but no ICA stenosis. Cervical ICA  patent to the skull base. Focal plaque just beneath the skull base results in 30% narrowing. Left carotid system: Left common carotid artery shows scattered plaque. 50% stenosis of the vessel in the midportion. Extensive calcified plaque at the carotid bifurcation and ICA bulb. Minimal stenosis in the ICA bulb is 30%. ICA patent beyond that to the skull base. Vertebral arteries: Right vertebral artery origin shows calcified plaque with 50-70% stenosis. Beyond that, the vessel is patent through the cervical region to the foramen magnum. 30% stenosis of the proximal left subclavian artery. Calcified plaque at the left vertebral artery origin. 50-70% stenosis in that location. Vessel is patent beyond that to the foramen magnum. Skeleton: Chronic fusion from C5-C7. Degenerative anterolisthesis at C3-4 of 2 mm and C4-5 of 6 mm. Degenerative anterolisthesis at C7-T1 of 3 mm. Other neck: No mass or lymphadenopathy. Upper chest: Lung apices show emphysema and pulmonary scarring. Review of the MIP images confirms the above findings CTA HEAD FINDINGS Anterior circulation: Both internal carotid arteries are patent through the skull base and siphon regions. 50% stenosis of the left internal carotid artery in the carotid canal. Siphon atherosclerotic calcification but without stenosis greater than 30%. The anterior and middle cerebral vessels are patent. No large vessel occlusion or flow limiting proximal stenosis. No aneurysm or vascular malformation. Distal vessels do show atherosclerotic irregularity. Posterior circulation: Both vertebral arteries are patent through the foramen magnum to the basilar artery. No basilar stenosis. Posterior circulation branch vessels are patent. Distal vessels do show atherosclerotic irregularity. Venous sinuses: Patent and normal Anatomic variants: None significant Review of the MIP images confirms the above findings IMPRESSION: 1. No intracranial large vessel occlusion or flow limiting  proximal stenosis. Distal vessel atherosclerotic irregularity. 2. Aortic atherosclerosis. 3. Atherosclerotic disease at both carotid bifurcations. 50% stenosis of the right common carotid artery at its origin and along the course of the vessel. 30% stenosis of the right ICA just beneath the skull base. 50% stenosis of the left common carotid artery in the midportion. 30% stenosis of the left ICA bulb. 4. 50-70% stenosis of both vertebral artery origins. 5. 50% stenosis of the left internal  carotid artery in the carotid canal. Electronically Signed   By: Paulina Fusi M.D.   On: 06/05/2023 20:48   CT HEAD WO CONTRAST Result Date: 06/05/2023 CLINICAL DATA:  Altered level of consciousness EXAM: CT HEAD WITHOUT CONTRAST TECHNIQUE: Contiguous axial images were obtained from the base of the skull through the vertex without intravenous contrast. RADIATION DOSE REDUCTION: This exam was performed according to the departmental dose-optimization program which includes automated exposure control, adjustment of the mA and/or kV according to patient size and/or use of iterative reconstruction technique. COMPARISON:  11/05/2019 FINDINGS: Brain: There are scattered hypodensities throughout the periventricular white matter, most consistent with chronic small vessel ischemic changes. Focal hypodensities are seen within the bilateral thalami and basal ganglia, consistent with chronic lacunar infarct. No other signs of acute infarct or hemorrhage. Lateral ventricles and remaining midline structures are unremarkable. No acute extra-axial fluid collections. No mass effect. Vascular: Stable atherosclerosis of the internal carotid arteries. No hyperdense vessel. Skull: Normal. Negative for fracture or focal lesion. Sinuses/Orbits: No acute finding. Other: None. IMPRESSION: 1. No acute intracranial process. 2. Extensive chronic small-vessel ischemic changes throughout the periventricular white matter, basal ganglia, and thalami.  Electronically Signed   By: Sharlet Salina M.D.   On: 06/05/2023 17:27    Procedures Procedures    Medications Ordered in ED Medications  iohexol (OMNIPAQUE) 350 MG/ML injection 75 mL (75 mLs Intravenous Contrast Given 06/05/23 2035)    ED Course/ Medical Decision Making/ A&P                                 Medical Decision Making Amount and/or Complexity of Data Reviewed Labs: ordered. Radiology: ordered.  Risk Prescription drug management.   83 year old male with a PMHx hypertension, persistent A-fib, h/o stroke on twice daily Eliquis (with some potential missed nighttime doses) presenting for aphasia and confusion x1 day.  Vital signs are stable.  Patient is in A-fib RVR with appropriate rates.  Patient is slightly hypertensive around 150s over 100s.  Satting well on room air.  LKN last night before bed. Initial differential includes TIA, intracranial hemorrhage, CVA, anemia, electrolyte abnormality, intoxication, UTI.  CBC is reassuring with hemoglobin 9.6 -previous value 3 years ago was similar.  No electrolyte or metabolic derangements -creatinine up 1.8 is similar to baseline.  Urinalysis does not appear to be infectious.  CT head shows no acute hemorrhage or large territory ischemic area.  Discussed the patient with neurology, Dr. Bing Neighbors, who advised MRI brain with and without contrast as well as MRA head and neck.   Dr. Erick Blinks, from Neurology, requested we limit the MRI to an MRI brain without contrast.  CTA head and neck notable for no large vessel occlusion, aortic atherosclerosis and atherosclerotic disease throughout numerous vessels of head and neck.  On reevaluation, patient's symptoms have improved during his time in the ED.  He has had no further episodes of confusion.  MRI brain notable for no acute intracranial abnormality with visualized old small vessel infarcts of the basal ganglia and thalami.  Patient was discussed with neurology, Dr.  Derry Lory, who endorses that patient is safe for discharge home with outpatient neurology follow-up ensuring that he continues to take both daily doses of his Eliquis.  This was communicated to the patient and family by neurology as well as myself.  Patient was discharged in hemodynamically stable condition with plan for outpatient follow-up.  Final Clinical Impression(s) /  ED Diagnoses Final diagnoses:  Aphasia  Confusion    Rx / DC Orders ED Discharge Orders     None      Renella Cunas, PGY 2 Emergency medicine   Renella Cunas, MD 06/05/23 2336    Renella Cunas, MD 06/05/23 0454    Renella Cunas, MD 06/05/23 0981    Pricilla Loveless, MD 06/09/23 [phone]

## 2023-06-05 NOTE — Consult Note (Signed)
 NEUROLOGY CONSULT NOTE   Date of service: June 05, 2023 Patient Name: Philip Ward MRN:  [id number] DOB:  05-Jul-1940 Chief Complaint: "Intermittent aphasia." Requesting Provider: Pricilla Loveless, MD  History of Present Illness  Philip Ward is a 83 y.o. male with hx of aortic atherosclerosis, prior history of strokes, persistent A-fib on Eliquis who presents with episode of word finding difficulty.  He reports that he went to [facility] this morning and was at his baseline.  He took a nap around 1330 and when he woke up around 1430, he was having difficulty finding words.  His symptoms were persistent and resolved shortly before arrival.  He is a bit here with his family, he has no focal deficits, he has no speech difficulty at this time.  He does endorse missing a dose of Eliquis last night.  Upon discussion with wife, it seems that he will miss some doses of Eliquis however takes most of his doses.   LKW: 1330 on 06/05/2023 Modified rankin score: 0-Completely asymptomatic and back to baseline post- stroke IV Thrombolysis: Not offered, patient on Eliquis and symptoms are resolved.   EVT: Not offered, patient is on Eliquis and symptoms are resolved.    NIHSS components Score: Comment  1a Level of Conscious 0[x]  1[]  2[]  3[]      1b LOC Questions 0[x]  1[]  2[]       1c LOC Commands 0[x]  1[]  2[]       2 Best Gaze 0[x]  1[]  2[]       3 Visual 0[x]  1[]  2[]  3[]      4 Facial Palsy 0[x]  1[]  2[]  3[]      5a Motor Arm - left 0[x]  1[]  2[]  3[]  4[]  UN[]    5b Motor Arm - Right 0[x]  1[]  2[]  3[]  4[]  UN[]    6a Motor Leg - Left 0[x]  1[]  2[]  3[]  4[]  UN[]    6b Motor Leg - Right 0[x]  1[]  2[]  3[]  4[]  UN[]    7 Limb Ataxia 0[x]  1[]  2[]  3[]  UN[]     8 Sensory 0[x]  1[]  2[]  UN[]      9 Best Language 0[x]  1[]  2[]  3[]      10 Dysarthria 0[x]  1[]  2[]  UN[]      11 Extinct. and Inattention 0[x]  1[]  2[]       TOTAL: 0      ROS  Comprehensive ROS performed and pertinent positives documented in HPI   Past History    Past Medical History:  Diagnosis Date   Arthritis    Atherosclerosis of aorta (HCC)    moderate per TEE   Bradycardia    chronic, asymptomatic   External carotid artery stenosis    LECA  moderate stenosis per duplex   History of bladder cancer urologist-  dr Philip Ward   History of colon polyps    hyperplastic   History of CVA with residual deficit neurologist-  dr Philip Ward   x2 CVA- ;    06-30-2011 left basal ganglia infarct;   10-26-2011  right thalamic infart , residual mild left face and hand   History of PSVT (paroxysmal supraventricular tachycardia) 08-02-2011    17 beats NSVT documented on event monitor (asymptomatic)   History of syncope    2013  felt to vasovagal response   Hypertension    Lumbar stenosis    Premature atrial contractions    Stroke [facility])     Past Surgical History:  Procedure Laterality Date   CYSTOSCOPY WITH BIOPSY N/A 02/18/2015   Procedure: CYSTOSCOPY WITH BLADDER BIOPSY;  Surgeon: Philip Pippin, MD;  Location:  [facility];  Service: Urology;  Laterality: N/A;   CYSTOSTOMY W/ BLADDER BIOPSY  05-05-2010   REPAIR LACERATED CORNEA LEFT EYE  yrs ago   TEE WITHOUT CARDIOVERSION  10/28/2011   Procedure: TRANSESOPHAGEAL ECHOCARDIOGRAM (TEE);  Surgeon: Philip Bunting, MD;  Location: [facility] ENDOSCOPY;  Service: Cardiovascular;  Laterality: N/A;    normal LV,  moderate descending aorta atherosclerosis   TRANSTHORACIC ECHOCARDIOGRAM  06-29-2011   mild LVH,  ef 55-60%,  mild MR and TR   TRANSURETHRAL RESECTION OF BLADDER TUMOR WITH MITOMYCIN-C  03-24-2010    Family History: Family History  Problem Relation Age of Onset   Heart Problems Mother     Social History  reports that he has been smoking pipe. He has never used smokeless tobacco. He reports current alcohol use. He reports that he does not use drugs.  Allergies  Allergen Reactions   Statins Swelling   Statins Anaphylaxis    Medications   Current Facility-Administered Medications:     LORazepam (ATIVAN) injection 0.5 mg, 0.5 mg, Intravenous, Once, Philip Cunas, MD  Current Outpatient Medications:    apixaban (ELIQUIS) 2.5 MG TABS tablet, Take 1 tablet (2.5 mg total) by mouth 2 (two) times daily., Disp: 60 tablet, Rfl: 5   fenofibrate 160 MG tablet, Take 160 mg by mouth daily., Disp: , Rfl:    metoprolol succinate (TOPROL XL) 25 MG 24 hr tablet, Take 1 tablet (25 mg total) by mouth in the morning and at bedtime., Disp: 180 tablet, Rfl: 3   Multiple Vitamin (MULITIVITAMIN WITH MINERALS) TABS, Take 1 tablet by mouth daily., Disp: , Rfl:    pantoprazole (PROTONIX) 40 MG tablet, Take 40 mg by mouth daily., Disp: , Rfl:    vitamin C (ASCORBIC ACID) 500 MG tablet, Take 500 mg by mouth daily., Disp: , Rfl:   Vitals   Vitals:   06/05/23 1845 06/05/23 2011 06/05/23 2145 06/05/23 2152  BP: (!) 152/104   (!) 151/91  Pulse: 100  70 (!) 48  Resp: 17  19 (!) 23  Temp:  98.3 F (36.8 C)    TempSrc:  Oral    SpO2: 100%  100% 100%    There is no height or weight on file to calculate BMI.  Physical Exam   General: Laying comfortably in bed; in no acute distress.  HENT: Normal oropharynx and mucosa. Normal external appearance of ears and nose.  Neck: Supple, no pain or tenderness  CV: No JVD. No peripheral edema.  Pulmonary: Symmetric Chest rise. Normal respiratory effort.  Abdomen: Soft to touch, non-tender.  Ext: No cyanosis, edema, or deformity  Skin: No rash. Normal palpation of skin.   Musculoskeletal: Normal digits and nails by inspection. No clubbing.   Neurologic Examination  Mental status/Cognition: Alert, oriented to self, place, does not know the month and year though. good attention.  Speech/language: Fluent, comprehension intact, object naming intact, repetition intact.  Cranial nerves:   CN II Pupils equal and reactive to light, no VF deficits    CN III,IV,VI EOM intact, no gaze preference or deviation, no nystagmus    CN V normal sensation in V1, V2, and V3  segments bilaterally    CN VII no asymmetry, no nasolabial fold flattening    CN VIII normal hearing to speech    CN IX & X normal palatal elevation, no uvular deviation    CN XI 5/5 head turn and 5/5 shoulder shrug bilaterally    CN XII midline tongue protrusion  Motor:  Muscle bulk: Normal, tone normal, pronator drift none tremor none Mvmt Root Nerve  Muscle Right Left Comments  SA C5/6 Ax Deltoid 5 5   EF C5/6 Mc Biceps 5 5   EE C6/7/8 Rad Triceps 5 5   WF C6/7 Med FCR     WE C7/8 PIN ECU     F Ab C8/T1 U ADM/FDI 5 5   HF L1/2/3 Fem Illopsoas 5 5   KE L2/3/4 Fem Quad 5 5   DF L4/5 D Peron Tib Ant 5 5   PF S1/2 Tibial Grc/Sol 5 5    Sensation:  Light touch Intact throughout   Pin prick    Temperature    Vibration   Proprioception    Coordination/Complex Motor:  - Finger to Nose intact bilaterally - Heel to shin intact bilaterally - Rapid alternating movement are normal - Gait: Gait deferred for patient safety. Labs/Imaging/Neurodiagnostic studies   CBC:  Recent Labs  Lab 06/11/2023 1612 06/11/2023 1631  WBC 5.3  --   NEUTROABS 3.4  --   HGB 9.6* 10.2*  HCT 29.7* 30.0*  MCV 94.6  --   PLT 175  --    Basic Metabolic Panel:  Lab Results  Component Value Date   NA 138 06-11-2023   K 4.2 06/11/23   CO2 20 (L) 06-11-2023   GLUCOSE 110 (H) June 11, 2023   BUN 32 (H) 2023-06-11   CREATININE 1.80 (H) 06/11/23   CALCIUM 8.9 11-Jun-2023   GFRNONAA 40 (L) 2023-06-11   GFRAA 44 (L) 11/05/2019   Lipid Panel:  Lab Results  Component Value Date   LDLCALC 87 02/01/2023   HgbA1c:  Lab Results  Component Value Date   HGBA1C 6.2 (H) 10/27/2011   Urine Drug Screen:     Component Value Date/Time   LABOPIA NONE DETECTED 10/26/2011 2324   COCAINSCRNUR NONE DETECTED 10/26/2011 2324   LABBENZ NONE DETECTED 10/26/2011 2324   AMPHETMU NONE DETECTED 10/26/2011 2324   THCU NONE DETECTED 10/26/2011 2324   LABBARB NONE DETECTED 10/26/2011 2324    Alcohol Level      Component Value Date/Time   ETH <10 06/11/23 1612   INR  Lab Results  Component Value Date   INR 1.3 (H) 2023/06/11   APTT  Lab Results  Component Value Date   APTT 35 2023-06-11   AED levels: No results found for: "PHENYTOIN", "ZONISAMIDE", "LAMOTRIGINE", "LEVETIRACETA"  CT Head without contrast(Personally reviewed): CTH was negative for a large hypodensity concerning for a large territory infarct or hyperdensity concerning for an ICH  CT angio Head and Neck with contrast(Personally reviewed): No LVO, no aneurysm.  MRI Brain(Personally reviewed): No acute stroke.  ASSESSMENT   DAMEER SPEISER is a 83 y.o. male with history of atrial fibrillation on Eliquis but missing some of his dose who missed his dose of Eliquis last night and this morning had aphasia that started on 1430 when he woke up from a nap and persisted for few hours and resolved.  He has no focal deficits at this time and is back to his baseline.  CTA does not show a large vessel occlusion or thrombus, MRI brain does not show a stroke.  I suspect that this was likely a TIA in the setting of missing doses of his Eliquis.  I counseled him on the importance of not missing his Eliquis doses.  I do not think the patient would benefit from a full stroke workup specially since he is already on Eliquis.  RECOMMENDATIONS  No further inpatient neurological workup at this time.  Okay to discharge from a neurostandpoint.  Again, I counseled him on the importance of not missing Eliquis. ______________________________________________________________________    Signed, Erick Blinks, MD [facility]

## 2023-06-05 NOTE — ED Triage Notes (Signed)
 PT arrives via EMS from home. Wife reported to EMS that patient began having slurred speech and confusion that started around 1500 today. Upon EMS arrival, slurred speech had resolved. He is still confused during triage. He is AxO to name and place. PT takes eliquis. No other focal neurological deficits noted.

## 2023-06-05 NOTE — Discharge Instructions (Addendum)
 You were evaluated in the ED today for aphasia and confusion.  Labs were reassuring.  No signs of infection.  A CT head, CTA head and neck, and MRI brain did not show any acute causes of your symptoms.  We consulted neurology for evaluation and they feel that you are safe for outpatient neurology follow-up.  Please ensure you are taking your Eliquis twice a day every day.

## 2023-06-06 LAB — CBG MONITORING, ED: Glucose-Capillary: 101 mg/dL — ABNORMAL HIGH (ref 70–99)

## 2023-06-22 ENCOUNTER — Emergency Department (HOSPITAL_BASED_OUTPATIENT_CLINIC_OR_DEPARTMENT_OTHER)
Admission: EM | Admit: 2023-06-22 | Discharge: 2023-06-22 | Disposition: A | Discharge status: Home / Self Care | Attending: Emergency Medicine | Admitting: Emergency Medicine

## 2023-06-22 ENCOUNTER — Other Ambulatory Visit: Payer: Self-pay

## 2023-06-22 ENCOUNTER — Emergency Department (HOSPITAL_BASED_OUTPATIENT_CLINIC_OR_DEPARTMENT_OTHER)

## 2023-06-22 ENCOUNTER — Encounter (HOSPITAL_BASED_OUTPATIENT_CLINIC_OR_DEPARTMENT_OTHER): Payer: Self-pay | Admitting: *Deleted

## 2023-06-22 ENCOUNTER — Encounter: Payer: Self-pay | Admitting: Cardiology

## 2023-06-22 DIAGNOSIS — I452 Bifascicular block: Secondary | ICD-10-CM | POA: Insufficient documentation

## 2023-06-22 DIAGNOSIS — Z7901 Long term (current) use of anticoagulants: Secondary | ICD-10-CM | POA: Diagnosis not present

## 2023-06-22 DIAGNOSIS — R4182 Altered mental status, unspecified: Secondary | ICD-10-CM | POA: Diagnosis not present

## 2023-06-22 DIAGNOSIS — I10 Essential (primary) hypertension: Secondary | ICD-10-CM | POA: Insufficient documentation

## 2023-06-22 DIAGNOSIS — R4701 Aphasia: Secondary | ICD-10-CM | POA: Diagnosis present

## 2023-06-22 DIAGNOSIS — I4891 Unspecified atrial fibrillation: Secondary | ICD-10-CM | POA: Diagnosis not present

## 2023-06-22 DIAGNOSIS — Z87891 Personal history of nicotine dependence: Secondary | ICD-10-CM | POA: Insufficient documentation

## 2023-06-22 LAB — DIFFERENTIAL
Abs Immature Granulocytes: 0.03 10*3/uL (ref 0.00–0.07)
Basophils Absolute: 0 10*3/uL (ref 0.0–0.1)
Basophils Relative: 1 %
Eosinophils Absolute: 0.1 10*3/uL (ref 0.0–0.5)
Eosinophils Relative: 3 %
Immature Granulocytes: 1 %
Lymphocytes Relative: 31 %
Lymphs Abs: 1.7 10*3/uL (ref 0.7–4.0)
Monocytes Absolute: 0.7 10*3/uL (ref 0.1–1.0)
Monocytes Relative: 12 %
Neutro Abs: 3 10*3/uL (ref 1.7–7.7)
Neutrophils Relative %: 52 %

## 2023-06-22 LAB — COMPREHENSIVE METABOLIC PANEL WITH GFR
ALT: 12 U/L (ref 0–44)
AST: 20 U/L (ref 15–41)
Albumin: 3.9 g/dL (ref 3.5–5.0)
Alkaline Phosphatase: 56 U/L (ref 38–126)
Anion gap: 9 (ref 5–15)
BUN: 29 mg/dL — ABNORMAL HIGH (ref 8–23)
CO2: 25 mmol/L (ref 22–32)
Calcium: 9.2 mg/dL (ref 8.9–10.3)
Chloride: 103 mmol/L (ref 98–111)
Creatinine, Ser: 1.5 mg/dL — ABNORMAL HIGH (ref 0.61–1.24)
GFR, Estimated: 46 mL/min — ABNORMAL LOW (ref >60)
Glucose, Bld: 87 mg/dL (ref 70–99)
Potassium: 3.7 mmol/L (ref 3.5–5.1)
Sodium: 137 mmol/L (ref 135–145)
Total Bilirubin: 0.7 mg/dL (ref 0.0–1.2)
Total Protein: 6.9 g/dL (ref 6.5–8.1)

## 2023-06-22 LAB — PROTIME-INR
INR: 1.2 (ref 0.8–1.2)
Prothrombin Time: 15.5 s — ABNORMAL HIGH (ref 11.4–15.2)

## 2023-06-22 LAB — CBC
HCT: 33.9 % — ABNORMAL LOW (ref 39.0–52.0)
Hemoglobin: 11 g/dL — ABNORMAL LOW (ref 13.0–17.0)
MCH: 30.3 pg (ref 26.0–34.0)
MCHC: 32.4 g/dL (ref 30.0–36.0)
MCV: 93.4 fL (ref 80.0–100.0)
Platelets: 236 10*3/uL (ref 150–400)
RBC: 3.63 MIL/uL — ABNORMAL LOW (ref 4.22–5.81)
RDW: 14 % (ref 11.5–15.5)
WBC: 5.6 10*3/uL (ref 4.0–10.5)
nRBC: 0 % (ref 0.0–0.2)

## 2023-06-22 LAB — APTT: aPTT: 29 s (ref 24–36)

## 2023-06-22 LAB — CBG MONITORING, ED: Glucose-Capillary: 80 mg/dL (ref 70–99)

## 2023-06-22 LAB — ETHANOL: Alcohol, Ethyl (B): <10 mg/dL (ref <10)

## 2023-06-22 MED ORDER — SODIUM CHLORIDE 0.9% FLUSH: 3.0000 mL | Freq: Once | INTRAVENOUS | Status: DC

## 2023-06-22 NOTE — Telephone Encounter (Signed)
Call placed to patient.  Left message to call office 

## 2023-06-22 NOTE — Discharge Instructions (Signed)
 Please follow-up with the neurologist have attached your today in regards [facility] ER visit.  Today your labs and imaging are reassuring and like I said you will need further workup by the neurologist in the outpatient setting.  If symptoms do change or worsen please return to the ER.

## 2023-06-22 NOTE — ED Notes (Signed)
 Urine in lab

## 2023-06-22 NOTE — ED Provider Notes (Signed)
 [place] EMERGENCY DEPARTMENT AT [facility] Provider Note   CSN: [id number] Arrival date & time: 06/22/23  1255     History  Chief Complaint  Patient presents with   Aphasia   Dizziness    Philip Ward is a 83 y.o. male history of CVA, A-fib on Eliquis, hypertension, nicotine use presented for aphasia and difficulty walking.  Patient was seen on 06/05/2023 and had normal CT/CTA/MRI of head and was told to follow-up with neurology.  Patient never received a call from neurology and states that symptoms have been persistent without any changes.  Patient does note however his ambulation has gotten worse as he usually walks with a cane however now uses a walker.  Patient states that he does feel like he is urinating more often but denies any dysuria.  Wife and daughter present state that patient sometimes will say words that appear inappropriate within the context of the conversation.  Patient denies any vision changes, headache, paresthesias, chest pain or shortness of breath or fevers nausea vomiting or abdominal pain.    Home Medications Prior to Admission medications   Medication Sig Start Date End Date Taking? Authorizing Provider  apixaban (ELIQUIS) 2.5 MG TABS tablet Take 1 tablet (2.5 mg total) by mouth 2 (two) times daily. 01/26/23   Patwardhan, Anabel Bene, MD  fenofibrate 160 MG tablet Take 160 mg by mouth daily. 10/05/19   [provider]  metoprolol succinate (TOPROL XL) 25 MG 24 hr tablet Take 1 tablet (25 mg total) by mouth in the morning and at bedtime. 06/28/22   Custovic, Rozell Searing, DO  Multiple Vitamin (MULITIVITAMIN WITH MINERALS) TABS Take 1 tablet by mouth daily.    [provider]  pantoprazole (PROTONIX) 40 MG tablet Take 40 mg by mouth daily. 10/05/19   [provider]  vitamin C (ASCORBIC ACID) 500 MG tablet Take 500 mg by mouth daily.    [provider]      Allergies    Statins and Statins    Review of Systems   Review  of Systems  Neurological:  Positive for dizziness.    Physical Exam Updated Vital Signs BP (!) 144/96   Pulse 89   Temp 97.7 F (36.5 C) (Oral)   Resp 14   SpO2 98%  Physical Exam Vitals reviewed.  Constitutional:      General: He is not in acute distress. HENT:     Head: Normocephalic and atraumatic.  Eyes:     Extraocular Movements: Extraocular movements intact.     Conjunctiva/sclera: Conjunctivae normal.     Pupils: Pupils are equal, round, and reactive to light.  Cardiovascular:     Rate and Rhythm: Normal rate and regular rhythm.     Pulses: Normal pulses.     Heart sounds: Normal heart sounds.     Comments: 2+ bilateral radial/dorsalis pedis pulses with regular rate Pulmonary:     Effort: Pulmonary effort is normal. No respiratory distress.     Breath sounds: Normal breath sounds.  Abdominal:     Palpations: Abdomen is soft.     Tenderness: There is no abdominal tenderness. There is no guarding or rebound.  Musculoskeletal:        General: Normal range of motion.     Cervical back: Normal range of motion and neck supple.     Comments: 5 out of 5 bilateral grip/leg extension strength  Skin:    General: Skin is warm and dry.     Capillary Refill:  Capillary refill takes less than 2 seconds.  Neurological:     General: No focal deficit present.     Mental Status: He is alert and oriented to person, place, and time.     Sensory: Sensation is intact.     Motor: Motor function is intact.     Coordination: Coordination is intact.     Gait: Gait abnormal.     Comments: Sensation intact in all 4 limbs Vision grossly intact Patient does seem to go to find words and has minor aphasia on exam but is able to recover and continue conversation with appropriate responses. Equal smile  Psychiatric:        Mood and Affect: Mood normal.     ED Results / Procedures / Treatments   Labs (all labs ordered are listed, but only abnormal results are displayed) Labs Reviewed   PROTIME-INR - Abnormal; Notable for the following components:      Result Value   Prothrombin Time 15.5 (*)    All other components within normal limits  CBC - Abnormal; Notable for the following components:   RBC 3.63 (*)    Hemoglobin 11.0 (*)    HCT 33.9 (*)    All other components within normal limits  COMPREHENSIVE METABOLIC PANEL WITH GFR - Abnormal; Notable for the following components:   BUN 29 (*)    Creatinine, Ser 1.50 (*)    GFR, Estimated 46 (*)    All other components within normal limits  APTT  DIFFERENTIAL  ETHANOL  CBG MONITORING, ED  CBG MONITORING, ED    EKG EKG Interpretation Date/Time:  Wednesday June 22 2023 13:11:25 EDT Ventricular Rate:  86 PR Interval:    QRS Duration:  144 QT Interval:  406 QTC Calculation: 485 R Axis:   -47  Text Interpretation: Atrial fibrillation Right bundle branch block Left anterior fascicular block Bifascicular block Minimal voltage criteria for LVH, may be normal variant ( R in aVL ) Abnormal ECG No significant change since last tracing Confirmed by Elayne Snare ([id number]) on 06/22/2023 2:10:26 PM  Radiology CT HEAD WO CONTRAST Result Date: 06/22/2023 CLINICAL DATA:  Neuro deficit, concern for stroke, slurred speech and left-sided weakness for several days. EXAM: CT HEAD WITHOUT CONTRAST TECHNIQUE: Contiguous axial images were obtained from the base of the skull through the vertex without intravenous contrast. RADIATION DOSE REDUCTION: This exam was performed according to the departmental dose-optimization program which includes automated exposure control, adjustment of the mA and/or kV according to patient size and/or use of iterative reconstruction technique. COMPARISON:  MRI head 06/05/2023, CT head 06/05/2023. FINDINGS: Brain: No acute intracranial hemorrhage. No CT evidence of acute infarct. Nonspecific hypoattenuation in the periventricular and subcortical white matter favored to reflect chronic microvascular ischemic  changes. Remote lacunar infarcts in the bilateral thalami again noted. Generalized parenchymal volume loss. No edema, mass effect, or midline shift. The basilar cisterns are patent. Ventricles: Prominence of the ventricles suggesting underlying parenchymal volume loss. Vascular: Atherosclerotic calcifications of the carotid siphons. No hyperdense vessel. Skull: No acute or aggressive finding. Orbits: Orbits are symmetric. Sinuses: The visualized paranasal sinuses are clear. Other: Mastoid air cells are clear. IMPRESSION: No significant interval change since 06/05/2023. No acute intracranial abnormality. Extensive chronic microvascular ischemic changes. Remote lacunar infarcts in the bilateral thalami. Electronically Signed   By: Emily Filbert M.D.   On: 06/22/2023 15:33    Procedures Procedures    Medications Ordered in ED Medications - No data to display  ED Course/ Medical  Decision Making/ A&P                                 Medical Decision Making Amount and/or Complexity of Data Reviewed Labs: ordered. Radiology: ordered.   Lottie Dawson 83 y.o. presented today for AMS. Working DDx that I considered at this time includes, but not limited to, CVA, ICH, intracranial mass, critical dehydration, heptatic dysfunction, uremia, hypercarbia/hypoxia/COPD exacerbation, intoxication, endocrine abnormality, toxidrome, adrenal insufficiency, hypoglycemia/hyperglycemia, paraneoplastic process, CNS infection, meningitis.  R/o DDx: CVA, ICH, intracranial mass, critical dehydration, heptatic dysfunction, uremia, hypercarbia/hypoxia/COPD exacerbation, intoxication, endocrine abnormality, toxidrome, adrenal insufficiency, hypoglycemia/hyperglycemia, paraneoplastic process, CNS infection, meningitis: These work and that it however unlikely due to patient's history, physical exam, presentation, lab/imaging findings  Review of prior external notes: 06/06/2023 unknown  Unique Tests and My Independent  Interpretation:  CT Head wo Contrast: No acute findings CBC: Unremarkable CMP: Unremarkable UA: Unremarkable Ethanol: Unremarkable aPTT: Unremarkable PT/INR: Unremarkable EKG: Rate, rhythm, axis, intervals all examined and without medically relevant abnormality. ST segments without concerns for elevations  Social Determinants of Health: none  Discussion with Independent Historian:  Wife, daughter  Discussion of Management of Tests: None  Risk: Low: based on diagnostic testing/clinical impression and treatment plan  Risk Stratification Score: None  Staffed with Theresia Lo, DO  Plan: On exam patient was no acute distress with stable vitals. The cardiac monitor was ordered secondary to the patient's history of AMS and to monitor the patient for dysrhythmia. Cardiac monitor by my independent interpretation showed normal sinus with PVCs.  On exam patient does appear to struggle for words however eventually can find the word and continue the conversation in the appropriate context.  The rest of his neuroexam does show that when he tries to walk he appears unsteady and family reports that he has gone from a cane to a walker.  Patient also does endorse urinating more often.  Patient was seen and had a full workup done on 06/05/2023 however did not receive a phone call from neurology to follow-up and so they have not seen a neurologist.  Will repeat labs here along with a head CT as we do not have MRI however I do suspect patient will need to follow-up with neurology outpatient if labs and imaging are reassuring.  Do question whether or not he has NPH however the ventricles do not appear enlarged on the CT scan but will wait for radiology read.  Patient's labs and imaging are reassuring.  Patient has had no new changes while in the ED department and after discussion with the patient and family we agreed that patient is safe to be discharged and follow-up with neurology in the outpatient setting as they  did not get the referral last time.  Will place neurology referral.  Discussed strict return precautions with the patient and family.  Attending and I discussed and we agreed the patient does not need to be sent for another MRI as he had one 2 weeks ago that was ultimately normal and symptoms have been the same.  Daughter did ask if this could be vascular dementia or Alzheimer's which I informed them a neurologist wife to further look into but not out of the differential.  Patient was given return precautions. Patient stable for discharge at this time.  Patient verbalized understanding of plan.  This chart was dictated using voice recognition software.  Despite best efforts to proofread,  errors can occur  which can change the documentation meaning.         Final Clinical Impression(s) / ED Diagnoses Final diagnoses:  Aphasia    Rx / DC Orders ED Discharge Orders          Ordered    Ambulatory referral to Neurology       Comments: An appointment is requested in approximately: 1 week   06/22/23 1551              Netta Corrigan, PA-C 06/22/23 1554    Theresia Lo [place] K, DO 06/23/23 [phone]

## 2023-06-22 NOTE — ED Triage Notes (Signed)
 Pt has been having difficulty with slurred speech as well as left sided weakness for the last several days.  Pt had the same symptoms march 16th and was seen at [facility] and discharged after negative workup.  Pt is alert and oriented in triage.

## 2023-06-23 NOTE — Telephone Encounter (Signed)
 This seems to be a unusual finding.  Please keep me updated about Neurology visit.  Thanks MJP

## 2023-07-04 ENCOUNTER — Ambulatory Visit: Admitting: Neurology

## 2023-07-04 ENCOUNTER — Encounter: Payer: Self-pay | Admitting: Neurology

## 2023-07-04 VITALS — BP 114/73 | HR 74 | Ht 72.0 in | Wt 148.2 lb

## 2023-07-04 DIAGNOSIS — R4702 Dysphasia: Secondary | ICD-10-CM

## 2023-07-04 DIAGNOSIS — R296 Repeated falls: Secondary | ICD-10-CM | POA: Diagnosis not present

## 2023-07-04 DIAGNOSIS — F0150 Vascular dementia without behavioral disturbance: Secondary | ICD-10-CM

## 2023-07-04 DIAGNOSIS — G309 Alzheimer's disease, unspecified: Secondary | ICD-10-CM | POA: Diagnosis not present

## 2023-07-04 DIAGNOSIS — Z8673 Personal history of transient ischemic attack (TIA), and cerebral infarction without residual deficits: Secondary | ICD-10-CM

## 2023-07-04 DIAGNOSIS — F0280 Dementia in other diseases classified elsewhere without behavioral disturbance: Secondary | ICD-10-CM

## 2023-07-04 MED ORDER — MEMANTINE HCL 28 X 5 MG & 21 X 10 MG PO TABS
ORAL_TABLET | ORAL | 12 refills | Status: DC
Start: 2023-07-04 — End: 2023-10-20

## 2023-07-04 MED ORDER — MEMANTINE HCL 10 MG PO TABS
10.0000 mg | ORAL_TABLET | Freq: Two times a day (BID) | ORAL | 3 refills | Status: DC
Start: 2023-07-04 — End: 2023-10-20

## 2023-07-04 NOTE — Patient Instructions (Addendum)
 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  I recommend further evaluation with checking memory panel labs and EEG.  Trial of Namenda if tolerated to help with cognitive improvement.  Refer to physical therapy for gait and balance training for his recent multiple falls.  Continue Eliquis for stroke prevention for atrial fibrillation and maintain aggressive risk factor modification strict control of hypertension with blood pressure goal below 130/90, lipids with LDL cholesterol goal below 70 mg percent and diabetes with hemoglobin A1c goal below 6.5%.  Patient may also need to look into his living situation and need more help which his wife may not be able to provide and will be better off moving into a memory care facility if he continues to get worse.  NAMENDA (Memantine) Tablets What is this medication? MEMANTINE (MEM an teen) treats memory loss and confusion (dementia) in people who have Alzheimer disease. It works by improving attention, memory, and the ability to engage in daily activities. It is not a cure for dementia or Alzheimer disease. This medicine may be used for other purposes; ask your health care provider or pharmacist if you have questions. COMMON BRAND NAME(S): Namenda What should I tell my care team before I take this medication? They need to know if you have any of these conditions: Kidney disease Liver disease Seizures Trouble passing urine An unusual or allergic reaction to memantine, other medications, foods, dyes, or preservatives Pregnant or trying to get pregnant Breast-feeding How should I use this medication? Take this medication by mouth with water. Follow the directions on the prescription label. You may take this medication with or without food. Take your doses at regular  intervals. Do not take your medication more often than directed. Continue to take your medication even if you feel better. Do not stop taking except on the advice of your care team. Talk to your care team about the use of this medication in children. Special care may be needed Overdosage: If you think you have taken too much of this medicine contact a poison control center or emergency room at once. NOTE: This medicine is only for you. Do not share this medicine with others. What if I miss a dose? If you miss a dose, take it as soon as you can. If it is almost time for your next dose, take only that dose. Do not take double or extra doses. If you do not take your medication for several days, contact your care team. Your dose may need to be changed. What may interact with this medication? Acetazolamide Amantadine Cimetidine Dextromethorphan Dofetilide Hydrochlorothiazide Ketamine Metformin Methazolamide Quinidine Ranitidine Sodium bicarbonate Triamterene This list may not describe all possible interactions. Give your health care provider a list of all the medicines, herbs, non-prescription drugs, or dietary supplements you use. Also tell them if you smoke, drink alcohol, or use illegal drugs. Some items may interact with your medicine. What should I watch for while using this medication? Visit your care team for regular checks on your progress. Check with your care team if there is no improvement in your symptoms or if they get worse. This medication may affect your coordination, reaction time, or judgment. Do not drive or operate machinery until you know how this medication affects you. Sit up or stand slowly to reduce the risk of dizzy or  fainting spells. Drinking alcohol with this medication can increase the risk of these side effects. What side effects may I notice from receiving this medication? Side effects that you should report to your care team as soon as possible: Allergic  reactions--skin rash, itching, hives, swelling of the face, lips, tongue, or throat Side effects that usually do not require medical attention (report to your care team if they continue or are bothersome): Confusion Constipation Diarrhea Dizziness Headache This list may not describe all possible side effects. Call your doctor for medical advice about side effects. You may report side effects to FDA at 1-800-FDA-1088. Where should I keep my medication? Keep out of the reach of children. Store at room temperature between 15 degrees and 30 degrees C (59 degrees and 86 degrees F). Throw away any unused medication after the expiration date. NOTE: This sheet is a summary. It may not cover all possible information. If you have questions about this medicine, talk to your doctor, pharmacist, or health care provider.  2024 Elsevier/Gold Standard (2021-03-31 00:00:00)

## 2023-07-04 NOTE — Progress Notes (Signed)
 [facility] [address] [place]. [place] [address] [phone]       OFFICE CONSULT NOTE  Philip Ward Date of Birth:  June 07, 1940 Medical Record Number:  [id number]   Referring MD: Evlyn Kanner PA-C  Reason for Referral: Speech difficulty  HPI: Philip Ward is a 83 year old Caucasian male seen today for initial office consultation visit for speech and language difficulties.  He is accompanied by his wife, son and daughter who provide history.  I also reviewed pertinent electronic medical records and imaging films in PACS personally.  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  Patient's family has noticed speech difficulties which began about a month ago.  He was actually seen in the ER for what was felt to be an episode of sudden onset of transient word finding difficulty.  Patient had gone to the [place] that morning and was at his baseline.  He took a nap for an hour in the day and upon awakening he had difficulty finding words and completing sentences.  His symptoms resolved by the time he was seen in the ER on 06/05/23.  MRI scan of the brain showed no acute abnormality and changes of small vessel disease and generalized atrophy.  Old right thalamic and left basal ganglia lacunar infarcts were noted.  CT angiogram of the brain and neck showed no large vessel stenosis.  Distal vessel irregularity was noted.  There was 50% stenosis of the right common carotid artery at its origin and 30% right ICA stenosis.  50% stenosis of the left common carotid artery in the midportion and 30% of the left ICA bulb.  50 to 70% stenosis of both vertebral artery origins.  50% stenosis of left ICA in the carotid canal.  Patient had missed a dose of Eliquis.  Patient was discharged home from the ER  per the family states that his speech difficulties have persisted.  He tries to speak and can get the first word out easily but then words get stuck in his mouth.  He has having trouble completing his sentences.  On inquiry they also admit that they have noticed that his memory is not as good.  He is requiring more help with activities of daily living.  He is living at home with his wife.  He can ambulate with a walker but they have noticed that at times is missing the urinal or unable to locate the towel on the side.  Patient has also had frequent falls.  He was seen in the ER on 06/22/2023 and had CT scan which showed no acute abnormality.  Patient has never been evaluated for cognitive impairment or started on any treatment for the same.  He has not had any more definite new strokes that the family is aware of since 2015.  He has been unable to tolerate statins due to severe statin allergies.  He is currently on fenofibrate.  He has not had any recent lipid profile checked in the last was in November 2024 and showed LDL-cholesterol to be 86 mg percent. ROS:   14 system review of systems is positive for speech difficulties word finding difficulties.  Unable to complete sentences, memory difficulties, poor balance, frequent falls, walking difficulty, decreased hearing all other systems negative  PMH:  Past Medical History:  Diagnosis Date   Arthritis  Atherosclerosis of aorta (HCC)    moderate per TEE   Bradycardia    chronic, asymptomatic   External carotid artery stenosis    LECA  moderate stenosis per duplex   History of bladder cancer urologist-  dr Annabell Howells   History of colon polyps    hyperplastic   History of CVA with residual deficit neurologist-  dr [name]   x2 CVA- ;    06-30-2011 left basal ganglia infarct;   10-26-2011  right thalamic infart , residual mild left face and hand   History of PSVT (paroxysmal supraventricular tachycardia) 08-02-2011    17 beats NSVT documented on event  monitor (asymptomatic)   History of syncope    2013  felt to vasovagal response   Hypertension    Lumbar stenosis    Premature atrial contractions    Stroke [facility])     Social History:  Social History   Socioeconomic History   Marital status: Married    Spouse name: Not on file   Number of children: 1   Years of education: college   Highest education level: Not on file  Occupational History   Occupation: retired    Associate Professor: [employer]  Tobacco Use   Smoking status: Every Day    Types: Pipe   Smokeless tobacco: Never   Tobacco comments:    using chantix and trying to quit  Vaping Use   Vaping status: Never Used  Substance and Sexual Activity   Alcohol use: Yes    Comment: social drinker   Drug use: Never   Sexual activity: Yes  Other Topics Concern   Not on file  Social History Narrative   ** Merged History Encounter **       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   Social Drivers of Corporate investment banker Strain: Not on file  Food Insecurity: Not on file  Transportation Needs: Not on file  Physical Activity: Not on file  Stress: Not on file  Social Connections: Not on file  Intimate Partner Violence: Not on file    Medications:   Current Outpatient Medications on File Prior to Visit  Medication Sig Dispense Refill   amLODipine (NORVASC) 2.5 MG tablet Take 2.5 mg by mouth daily.     apixaban (ELIQUIS) 2.5 MG TABS tablet Take 1 tablet (2.5 mg total) by mouth 2 (two) times daily. 60 tablet 5   fenofibrate 160 MG tablet Take 160 mg by mouth daily.     metoprolol succinate (TOPROL XL) 25 MG 24 hr tablet Take 1 tablet (25 mg total) by mouth in the morning and at bedtime. 180 tablet 3   Multiple Vitamin (MULITIVITAMIN WITH MINERALS) TABS Take 1 tablet by mouth daily.     pantoprazole (PROTONIX) 40 MG tablet Take 40 mg by mouth daily.     vitamin C (ASCORBIC ACID) 500 MG tablet Take 500 mg by mouth daily.     No current facility-administered medications on file prior  to visit.    Allergies:   Allergies  Allergen Reactions   Statins Swelling   Statins Anaphylaxis    Physical Exam General: Frail elderly Caucasian male, seated, in no evident distress Head: head normocephalic and atraumatic.   Neck: supple with no carotid or supraclavicular bruits Cardiovascular: regular rate and rhythm, no murmurs Musculoskeletal: Severe kyphoscoliosis.   Skin:  no rash/petichiae Vascular:  Normal pulses all extremities  Neurologic Exam Mental Status: Awake and fully alert. Oriented to place and time. Recent and remote memory  poor. Attention span, concentration and fund of knowledge diminished ood and affect appropriate.  MMSE score 13/30.  Unable to copy intersecting pentagons.  Unable to draw clock.  Able to name only 6 animals which can walk on 4 legs.  On functional activity questionnaire scored 11 suggestive of severe dependence. Cranial Nerves: Fundoscopic exam not done. Pupils equal, briskly reactive to light. Extraocular movements full without nystagmus. Visual fields full to confrontation. Hearing mildly diminished bilaterally. Facial sensation intact. Face, tongue, palate moves normally and symmetrically.  Motor: Normal bulk and tone. Normal strength in all tested extremity muscles. Sensory.: intact to touch , pinprick , position and vibratory sensation.  Coordination: Rapid alternating movements normal in all extremities. Finger-to-nose and heel-to-shin performed accurately bilaterally. Gait and Station: Arises from chair with moderate difficulty requiring 1 person assistance to stand. Stance is stooped. Gait demonstrates some festination and poor balance.  Unable to do tandem walking reflexes: 1+ and symmetric. Toes downgoing.     07/04/2023    3:44 PM  MMSE - Mini Mental State Exam  Orientation to time 1  Orientation to Place 3  Registration 3  Attention/ Calculation 0  Recall 0  Language- name 2 objects 2  Language- repeat 0  Language- follow 3 step  command 3  Language- read & follow direction 1  Write a sentence 0  Copy design 0  Total score 13      ASSESSMENT: 83 year old Caucasian male with speech and cognitive difficulties likely due to moderate mixed vascular dementia and Alzheimer's.  Remote history of lacunar strokes in 2013 and 2015.  Vascular risk factors of atrial fibrillation, hypertension and hyperlipidemia and cerebrovascular disease     PLAN:I had a long discussion with the patient, his wife and son and daughter regarding his speech and cognitive difficulties which likely appear to be mixed vascular and Alzheimer's dementia and remote history of strokes and answered questions.  I recommend further evaluation with checking memory panel labs and EEG.  Trial of Namenda if tolerated to help with cognitive improvement.  Refer to physical therapy for gait and balance training for his recent multiple falls which are likely from deconditioning..  Continue Eliquis for stroke prevention for atrial fibrillation and maintain aggressive risk factor modification strict control of hypertension with blood pressure goal below 130/90, lipids with LDL cholesterol goal below 70 mg percent and diabetes with hemoglobin A1c goal below 6.5%. Check lipid profile and HbA1c today. Patient may also need to look into his living situation and need more help which his wife may not be able to provide and will be better off moving into a memory care facility if he continues to get worse. Greater than 50% time during this prolonged 60-minute visit was spent in counseling and coordination of care about his cognitive impairment dementia and remote strokes and answering questions. Ardella Beaver, MD Note: This document was prepared with digital dictation and possible smart phrase technology. Any transcriptional errors that result from this process are unintentional.

## 2023-07-06 LAB — DEMENTIA PANEL
Homocysteine: 19.4 umol/L (ref 0.0–21.3)
RPR Ser Ql: NONREACTIVE
TSH: 3.25 u[IU]/mL (ref 0.450–4.500)
Vitamin B-12: 497 pg/mL (ref 232–1245)

## 2023-07-06 LAB — HEMOGLOBIN A1C
Est. average glucose Bld gHb Est-mCnc: 123 mg/dL
Hgb A1c MFr Bld: 5.9 % — ABNORMAL HIGH (ref 4.8–5.6)

## 2023-07-06 LAB — LIPID PANEL
Chol/HDL Ratio: 2.8 ratio (ref 0.0–5.0)
Cholesterol, Total: 168 mg/dL (ref 100–199)
HDL: 61 mg/dL (ref >39)
LDL Chol Calc (NIH): 96 mg/dL (ref 0–99)
Triglycerides: 52 mg/dL (ref 0–149)
VLDL Cholesterol Cal: 11 mg/dL (ref 5–40)

## 2023-07-07 ENCOUNTER — Other Ambulatory Visit: Admitting: *Deleted

## 2023-07-07 ENCOUNTER — Telehealth: Payer: Self-pay | Admitting: Neurology

## 2023-07-07 ENCOUNTER — Encounter: Payer: Self-pay | Admitting: Neurology

## 2023-07-07 ENCOUNTER — Ambulatory Visit: Admitting: Neurology

## 2023-07-07 DIAGNOSIS — R4182 Altered mental status, unspecified: Secondary | ICD-10-CM

## 2023-07-07 DIAGNOSIS — F0150 Vascular dementia without behavioral disturbance: Secondary | ICD-10-CM

## 2023-07-07 NOTE — Telephone Encounter (Signed)
 PT referral has been placed on 4/14. I will see if referral coordinator can use that referral of if a new one is needed to be entered in

## 2023-07-07 NOTE — Telephone Encounter (Signed)
 Patient stopped by check out after EEG and asked for PT referral to be sent to [facility]

## 2023-07-10 NOTE — Progress Notes (Signed)
 Kindly inform the patient that EMG obtained today study shows no seizure activity.  There is mild slowing of brainwave activity which may be seen due to dementia or old age.  Nothing to worry about

## 2023-07-10 NOTE — Progress Notes (Signed)
 Kindly inform the patient that lab work for reversible causes of memory loss was all normal.  Screening test for diabetes was also satisfactory. Cholesterol profile is mostly okay except bad cholesterol is still elevated.  I know he has history of allergy to statin.  I recommended trial of Repatha or Praluent injection if patient is willing I can prescribe it

## 2023-07-11 NOTE — Telephone Encounter (Signed)
 PT referral letter sent to [facility] therapy department 4/21. Successful fax confirmation received.

## 2023-07-14 DIAGNOSIS — F0150 Vascular dementia without behavioral disturbance: Secondary | ICD-10-CM | POA: Diagnosis not present

## 2023-07-14 DIAGNOSIS — E785 Hyperlipidemia, unspecified: Secondary | ICD-10-CM | POA: Diagnosis not present

## 2023-07-14 DIAGNOSIS — I10 Essential (primary) hypertension: Secondary | ICD-10-CM | POA: Diagnosis not present

## 2023-07-14 DIAGNOSIS — I480 Paroxysmal atrial fibrillation: Secondary | ICD-10-CM | POA: Diagnosis not present

## 2023-09-06 ENCOUNTER — Other Ambulatory Visit: Payer: Self-pay | Admitting: Cardiology

## 2023-09-06 DIAGNOSIS — I482 Chronic atrial fibrillation, unspecified: Secondary | ICD-10-CM

## 2023-09-06 NOTE — Telephone Encounter (Signed)
 Prescription refill request for Eliquis  received. Indication:afib Last office visit:11/24 Scr:1.50  4/25 Age: 83 Weight:67.2  kg  Prescription refilled

## 2023-10-20 ENCOUNTER — Encounter: Payer: Self-pay | Admitting: Adult Health

## 2023-10-20 ENCOUNTER — Ambulatory Visit: Admitting: Adult Health

## 2023-10-20 VITALS — BP 129/82 | HR 76 | Ht 72.0 in | Wt 149.0 lb

## 2023-10-20 DIAGNOSIS — F0280 Dementia in other diseases classified elsewhere without behavioral disturbance: Secondary | ICD-10-CM | POA: Diagnosis not present

## 2023-10-20 DIAGNOSIS — G309 Alzheimer's disease, unspecified: Secondary | ICD-10-CM | POA: Diagnosis not present

## 2023-10-20 DIAGNOSIS — F0150 Vascular dementia without behavioral disturbance: Secondary | ICD-10-CM | POA: Diagnosis not present

## 2023-10-20 MED ORDER — MEMANTINE HCL 10 MG PO TABS: 10.0000 mg | ORAL_TABLET | Freq: Two times a day (BID) | ORAL | 3 refills | Status: AC

## 2023-10-20 NOTE — Progress Notes (Signed)
 [facility] [address] [place]. [place] [address] [phone]       OFFICE FOLLOW UP NOTE  Mr. Philip Philip Ward Date of Birth:  04-12-1940 Medical Record Number:  [id number]   Primary neurologist: Dr. Rosemarie Reason for visit: Speech and language difficulties   Chief complaint: Chief Complaint  Patient presents with   Dementia    Rm 3 with wife, son and daughter in law  Pt is well and stable. Reports no new concerns.       HPI:   Update 10/20/2023 JM: Patient returns for follow-up visit accompanied by his wife, son and daughter-in-law. Believes memory has been overall stable since prior visit without any noticeable decline. Continues to struggle with language but believes this has improved since working with SLP.  He is now living at [facility] assisted living facility in memory care (but not locked unit). He does get assistance/supervision for showers but otherwise able to maintain ADLs independently, he does receive assistance with medications and is provided meals.  He enjoys socializing with the other residents.  Reports he sleeps well and has a good appetite.  Ambulates without AD, no recent falls.  No behavioral concerns. MMSE today 25/30, deficits in orientation to time (date and year), recall and copy design.  Remains on memantine  10 mg twice daily without side effects.       Consult visit 07/04/2023 Dr. Rosemarie: Mr. Philip Philip Ward is a 83 year old Caucasian male seen today for initial office consultation visit for speech and language difficulties.  He is accompanied by his wife, son and daughter who provide history.  I also reviewed pertinent electronic medical records and imaging films in PACS personally.  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  Patient's family has  noticed speech difficulties which began about a month ago.  He was actually seen in the ER for what was felt to be an episode of sudden onset of transient word finding difficulty.  Patient had gone to the [place] that morning and was at his baseline.  He took a nap for an hour in the day and upon awakening he had difficulty finding words and completing sentences.  His symptoms resolved by the time he was seen in the ER on 06/05/23.  MRI scan of the brain showed no acute abnormality and changes of small vessel disease and generalized atrophy.  Old right thalamic and left basal ganglia lacunar infarcts were noted.  CT angiogram of the brain and neck showed no large vessel stenosis.  Distal vessel irregularity was noted.  There was 50% stenosis of the right common carotid artery at its origin and 30% right ICA stenosis.  50% stenosis of the left common carotid artery in the midportion and 30% of the left ICA bulb.  50 to 70% stenosis of both vertebral artery origins.  50% stenosis of left ICA in the carotid canal.  Patient had missed a dose of Eliquis .  Patient was discharged home from the ER per the family states that his speech difficulties have persisted.  He tries to speak and can get the first word out easily but then words get stuck in his mouth.  He has having trouble completing his sentences.  On inquiry they also admit that they have noticed that his memory is not as good.  He is requiring more help with activities of daily  living.  He is living at home with his wife.  He can ambulate with a walker but they have noticed that at times is missing the urinal or unable to locate the towel on the side.  Patient has also had frequent falls.  He was seen in the ER on 06/22/2023 and had CT scan which showed no acute abnormality.  Patient has never been evaluated for cognitive impairment or started on any treatment for the same.  He has not had any more definite new strokes that the family is aware of since 2015.  He has  been unable to tolerate statins due to severe statin allergies.  He is currently on fenofibrate.  He has not had any recent lipid profile checked in the last was in November 2024 and showed LDL-cholesterol to be 86 mg percent.    ROS:   14 system review of systems is positive for those listed in HPI and all other systems negative  PMH:  Past Medical History:  Diagnosis Date   Arthritis    Atherosclerosis of aorta (HCC)    moderate per TEE   Bradycardia    chronic, asymptomatic   External carotid artery stenosis    LECA  moderate stenosis per duplex   History of bladder cancer urologist-  dr Philip Philip Ward   History of colon polyps    hyperplastic   History of CVA with residual deficit neurologist-  dr Philip Philip Ward   x2 CVA- ;    06-30-2011 left basal ganglia infarct;   10-26-2011  right thalamic infart , residual mild left face and hand   History of PSVT (paroxysmal supraventricular tachycardia) 08-02-2011    17 beats NSVT documented on event monitor (asymptomatic)   History of syncope    2013  felt to vasovagal response   Hypertension    Lumbar stenosis    Premature atrial contractions    Stroke [facility])     Social History:  Social History   Socioeconomic History   Marital status: Married    Spouse name: Not on file   Number of children: 1   Years of education: college   Highest education level: Not on file  Occupational History   Occupation: retired    Associate Professor: [employer]  Tobacco Use   Smoking status: Every Day    Types: Pipe   Smokeless tobacco: Never   Tobacco comments:    using chantix and trying to quit  Vaping Use   Vaping status: Never Used  Substance and Sexual Activity   Alcohol use: Yes    Comment: social drinker   Drug use: Never   Sexual activity: Yes  Other Topics Concern   Not on file  Social History Narrative   ** Merged History Encounter **       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   Social Drivers of Corporate investment banker Strain: Not on file  Food  Insecurity: Not on file  Transportation Needs: Not on file  Physical Activity: Not on file  Stress: Not on file  Social Connections: Not on file  Intimate Partner Violence: Not on file    Medications:   Current Outpatient Medications on File Prior to Visit  Medication Sig Dispense Refill   amLODipine  (NORVASC ) 2.5 MG tablet Take 2.5 mg by mouth daily.     ELIQUIS  2.5 MG TABS tablet TAKE 1 TABLET BY MOUTH TWICE A DAY 60 tablet 5   fenofibrate 160 MG tablet Take 160 mg by mouth daily.  memantine  (NAMENDA  TITRATION PAK) tablet pack 5 mg/day for =1 week; 5 mg twice daily for =1 week; 15 mg/day given in 5 mg and 10 mg separated doses for =1 week; then 10 mg twice daily 49 tablet 12   memantine  (NAMENDA ) 10 MG tablet Take 1 tablet (10 mg total) by mouth 2 (two) times daily. 60 tablet 3   metoprolol  succinate (TOPROL  XL) 25 MG 24 hr tablet Take 1 tablet (25 mg total) by mouth in the morning and at bedtime. 180 tablet 3   Multiple Vitamin (MULITIVITAMIN WITH MINERALS) TABS Take 1 tablet by mouth daily.     pantoprazole (PROTONIX) 40 MG tablet Take 40 mg by mouth daily.     vitamin C  (ASCORBIC ACID ) 500 MG tablet Take 500 mg by mouth daily.     No current facility-administered medications on file prior to visit.    Allergies:   Allergies  Allergen Reactions   Statins Swelling   Statins Anaphylaxis    Physical Exam Today's Vitals   10/20/23 1349  BP: 129/82  Pulse: 76  Weight: 149 lb (67.6 kg)  Height: 6' (1.829 m)   Body mass index is 20.21 kg/m.  General: Frail very pleasant elderly Caucasian male, seated, in no evident distress Head: head normocephalic and atraumatic.   Neck: supple with no carotid or supraclavicular bruits Cardiovascular: irregular rate and rhythm, no murmurs Musculoskeletal: Severe kyphoscoliosis.   Skin:  no rash/petichiae Vascular:  Normal pulses all extremities  Neurologic Exam Mental Status: Awake and fully alert. Oriented to place and time.  Recent memory impaired and remote memory intact. Attention span, concentration and fund of knowledge mildly impaired. Mood and affect appropriate.  Clock drawing 2/4 (previously unable to do), named 7 4 legged animals in 60 seconds (previously 6)  Cranial Nerves: Pupils equal, briskly reactive to light. Extraocular movements full without nystagmus. Visual fields full to confrontation. Hearing mildly diminished bilaterally. Facial sensation intact. Face, tongue, palate moves normally and symmetrically.  Motor: Normal bulk and tone. Normal strength in all tested extremity muscles except mild LLE weakness (chronic per patient) Sensory.: intact to touch , pinprick , position and vibratory sensation.  Coordination: Rapid alternating movements normal in all extremities. Finger-to-nose and heel-to-shin performed accurately bilaterally. Gait and Station: Arises from chair with moderate difficulty requiring 1 person assistance to stand. Stance is stooped. Gait demonstrates slow cautious gait with use of rollator walker.  reflexes: 1+ and symmetric. Toes downgoing.      10/20/2023    2:24 PM 07/04/2023    3:44 PM  MMSE - Mini Mental State Exam  Orientation to time 3 1  Orientation to Place 5 3  Registration 3 3  Attention/ Calculation 5 0  Attention/Calculation-comments able to spell world backwards   Recall 1 0  Language- name 2 objects 2 2  Language- repeat 1 0  Language- follow 3 step command 3 3  Language- read & follow direction 1 1  Write a sentence 1 0  Copy design 0 0  Total score 25 13       ASSESSMENT: 83 year old Caucasian male with speech and cognitive difficulties likely due to moderate mixed vascular dementia and Alzheimer's. EEG normal.  Dementia panel benign.  Remote history of lacunar strokes in 2013 and 2015.  Vascular risk factors of atrial fibrillation, hypertension and hyperlipidemia and cerebrovascular disease     PLAN:  - overall improvement of cognition and speech  since prior visit - Continue memantine  10 mg twice daily - Can  consider initiating donepezil in the future but as cognition currently stable, will hold off for now - Continue to do exercises as advised by SLP, consider restart at the beginning of next year, currently out of visits for the remainder of this year - Continue routine socialization and continued participation in social activities.  Discussed the importance of cognitive and physical exercises as well as ensuring good sleep, healthy diet and management of vascular risk factors    Follow-up in 6 to 8 months or call earlier if needed    I personally spent a total of 45 minutes in the care of the patient today including preparing to see the patient, performing a medically appropriate exam/evaluation, counseling and educating, placing orders, and documenting clinical information in the EHR.This is our first time meeting and time has been spent reviewing past medical history and relevant medical records.   Harlene Bogaert, AGNP-BC  [facility] [address] [place], [place] [address]  Phone [phone] Fax [phone] Note: This document was prepared with digital dictation and possible smart phrase technology. Any transcriptional errors that result from this process are unintentional.

## 2023-10-20 NOTE — Patient Instructions (Addendum)
 Your Plan:  Continue Namenda  10mg  twice daily   Can consider starting Aricept in the future if needed  Ensure you continue to do exercises as advised by speech therapy   Continue to participate in social activities at [facility] and ensure routine socialization as this can greatly help with your memory and speech.      Follow up in 6 to 8 months or call earlier if needed      Thank you for coming to see us  at [facility]. I hope we have been able to provide you high quality care today.  You may receive a patient satisfaction survey over the next few weeks. We would appreciate your feedback and comments so that we may continue to improve ourselves and the health of our patients.

## 2023-11-11 DIAGNOSIS — E785 Hyperlipidemia, unspecified: Secondary | ICD-10-CM | POA: Diagnosis not present

## 2023-11-11 DIAGNOSIS — I10 Essential (primary) hypertension: Secondary | ICD-10-CM | POA: Diagnosis not present

## 2023-11-11 DIAGNOSIS — K219 Gastro-esophageal reflux disease without esophagitis: Secondary | ICD-10-CM | POA: Diagnosis not present

## 2023-12-16 ENCOUNTER — Emergency Department (HOSPITAL_COMMUNITY)
Admission: EM | Admit: 2023-12-16 | Discharge: 2023-12-16 | Disposition: A | Discharge status: Home / Self Care | Attending: Emergency Medicine | Admitting: Emergency Medicine

## 2023-12-16 ENCOUNTER — Other Ambulatory Visit: Payer: Self-pay

## 2023-12-16 DIAGNOSIS — R197 Diarrhea, unspecified: Secondary | ICD-10-CM | POA: Insufficient documentation

## 2023-12-16 DIAGNOSIS — R630 Anorexia: Secondary | ICD-10-CM | POA: Diagnosis not present

## 2023-12-16 DIAGNOSIS — Z7901 Long term (current) use of anticoagulants: Secondary | ICD-10-CM | POA: Diagnosis not present

## 2023-12-16 LAB — CBC
HCT: 31.7 % — ABNORMAL LOW (ref 39.0–52.0)
Hemoglobin: 10.1 g/dL — ABNORMAL LOW (ref 13.0–17.0)
MCH: 29.3 pg (ref 26.0–34.0)
MCHC: 31.9 g/dL (ref 30.0–36.0)
MCV: 91.9 fL (ref 80.0–100.0)
Platelets: 171 K/uL (ref 150–400)
RBC: 3.45 MIL/uL — ABNORMAL LOW (ref 4.22–5.81)
RDW: 15.1 % (ref 11.5–15.5)
WBC: 5.4 K/uL (ref 4.0–10.5)
nRBC: 0 % (ref 0.0–0.2)

## 2023-12-16 LAB — COMPREHENSIVE METABOLIC PANEL WITH GFR
ALT: 19 U/L (ref 0–44)
AST: 32 U/L (ref 15–41)
Albumin: 3.6 g/dL (ref 3.5–5.0)
Alkaline Phosphatase: 58 U/L (ref 38–126)
Anion gap: 10 (ref 5–15)
BUN: 26 mg/dL — ABNORMAL HIGH (ref 8–23)
CO2: 22 mmol/L (ref 22–32)
Calcium: 8.8 mg/dL — ABNORMAL LOW (ref 8.9–10.3)
Chloride: 108 mmol/L (ref 98–111)
Creatinine, Ser: 1.24 mg/dL (ref 0.61–1.24)
GFR, Estimated: 57 mL/min — ABNORMAL LOW (ref >60)
Glucose, Bld: 91 mg/dL (ref 70–99)
Potassium: 3.5 mmol/L (ref 3.5–5.1)
Sodium: 141 mmol/L (ref 135–145)
Total Bilirubin: 0.6 mg/dL (ref 0.0–1.2)
Total Protein: 6.2 g/dL — ABNORMAL LOW (ref 6.5–8.1)

## 2023-12-16 NOTE — ED Triage Notes (Signed)
 BIBA from [facility] Memory care for diarrhea for 4 days, decreased intake. Denies abd pain, n/v, dizziness.Hx of dementia, A&O x 3.

## 2023-12-16 NOTE — ED Provider Notes (Signed)
  EMERGENCY DEPARTMENT AT [facility] Provider Note   CSN: [id number] Arrival date & time: 12/16/23  1539     Patient presents with: Diarrhea   Philip Ward is a 83 y.o. male.    Diarrhea Patient presents from [facility] memory care.  Has had diarrhea for the last 4 days.  Somewhat decreased oral intake although patient states he thinks he is keeping up well.  No known diarrhea going around.  No abdominal pain.  No recent antibiotics.  No vomiting.     Prior to Admission medications   Medication Sig Start Date End Date Taking? Authorizing Provider  amLODipine  (NORVASC ) 2.5 MG tablet Take 2.5 mg by mouth daily.    [provider]  ELIQUIS  2.5 MG TABS tablet TAKE 1 TABLET BY MOUTH TWICE A DAY 09/06/23   Patwardhan, Manish J, MD  fenofibrate 160 MG tablet Take 160 mg by mouth daily. 10/05/19   [provider]  memantine  (NAMENDA ) 10 MG tablet Take 1 tablet (10 mg total) by mouth 2 (two) times daily. 10/20/23   Whitfield Raisin, NP  metoprolol  succinate (TOPROL  XL) 25 MG 24 hr tablet Take 1 tablet (25 mg total) by mouth in the morning and at bedtime. 06/28/22   Custovic, Sabina, DO  Multiple Vitamin (MULITIVITAMIN WITH MINERALS) TABS Take 1 tablet by mouth daily.    [provider]  pantoprazole (PROTONIX) 40 MG tablet Take 40 mg by mouth daily. 10/05/19   [provider]  vitamin C  (ASCORBIC ACID ) 500 MG tablet Take 500 mg by mouth daily.    [provider]    Allergies: Statins and Statins    Review of Systems  Gastrointestinal:  Positive for diarrhea.    Updated Vital Signs BP (!) 164/85   Pulse 66   Temp 97.7 F (36.5 C) (Oral)   Resp 17   SpO2 100%   Physical Exam Vitals and nursing note reviewed.  HENT:     Head: Normocephalic.  Cardiovascular:     Rate and Rhythm: Normal rate and regular rhythm.  Pulmonary:     Breath sounds: No wheezing.  Abdominal:     Tenderness: There is no abdominal  tenderness.  Musculoskeletal:        General: No tenderness.  Skin:    General: Skin is warm.     Capillary Refill: Capillary refill takes less than 2 seconds.  Neurological:     Mental Status: He is alert. Mental status is at baseline.     (all labs ordered are listed, but only abnormal results are displayed) Labs Reviewed  COMPREHENSIVE METABOLIC PANEL WITH GFR - Abnormal; Notable for the following components:      Result Value   BUN 26 (*)    Calcium 8.8 (*)    Total Protein 6.2 (*)    GFR, Estimated 57 (*)    All other components within normal limits  CBC - Abnormal; Notable for the following components:   RBC 3.45 (*)    Hemoglobin 10.1 (*)    HCT 31.7 (*)    All other components within normal limits  C DIFFICILE QUICK SCREEN W PCR REFLEX    GASTROINTESTINAL PANEL BY PCR, STOOL (REPLACES STOOL CULTURE)    EKG: None  Radiology: No results found.   Procedures   Medications Ordered in the ED - No data to display  Medical Decision Making Amount and/or Complexity of Data Reviewed Labs: ordered.   Patient with diarrhea.  Reassuring vital signs.  Benign abdominal exam.  Differential diagnose does include various causes of infectious diarrhea.  Stool studies ordered. Blood work done and does show a hemoglobin of 10.1 which appears to be near his baseline.  CMP done which showed BUN mildly elevated 26 which also appears to be his baseline.  Creatinine however 1.2 which appears less than his baseline.  Overall well-appearing.  Tolerating orals.  Should likely be able to discharge home.  So far has not had diarrhea here.  Has felt better now and will discharge home.      Final diagnoses:  Diarrhea, unspecified type    ED Discharge Orders     None          [name], MD 12/16/23 2324

## 2023-12-28 DIAGNOSIS — I10 Essential (primary) hypertension: Secondary | ICD-10-CM | POA: Diagnosis not present

## 2023-12-28 DIAGNOSIS — K219 Gastro-esophageal reflux disease without esophagitis: Secondary | ICD-10-CM | POA: Diagnosis not present

## 2023-12-28 DIAGNOSIS — E785 Hyperlipidemia, unspecified: Secondary | ICD-10-CM | POA: Diagnosis not present

## 2024-02-13 DIAGNOSIS — R531 Weakness: Secondary | ICD-10-CM | POA: Diagnosis not present

## 2024-02-13 DIAGNOSIS — A0472 Enterocolitis due to Clostridium difficile, not specified as recurrent: Secondary | ICD-10-CM | POA: Diagnosis not present

## 2024-02-20 DIAGNOSIS — E785 Hyperlipidemia, unspecified: Secondary | ICD-10-CM | POA: Diagnosis not present

## 2024-02-20 DIAGNOSIS — K219 Gastro-esophageal reflux disease without esophagitis: Secondary | ICD-10-CM | POA: Diagnosis not present

## 2024-02-20 DIAGNOSIS — I10 Essential (primary) hypertension: Secondary | ICD-10-CM | POA: Diagnosis not present

## 2024-02-20 DIAGNOSIS — I4891 Unspecified atrial fibrillation: Secondary | ICD-10-CM | POA: Diagnosis not present

## 2024-02-29 DIAGNOSIS — R5381 Other malaise: Secondary | ICD-10-CM | POA: Diagnosis not present

## 2024-02-29 DIAGNOSIS — R531 Weakness: Secondary | ICD-10-CM | POA: Diagnosis not present

## 2024-02-29 DIAGNOSIS — R197 Diarrhea, unspecified: Secondary | ICD-10-CM | POA: Diagnosis not present

## 2024-03-08 ENCOUNTER — Emergency Department (HOSPITAL_COMMUNITY)
Admission: EM | Admit: 2024-03-08 | Discharge: 2024-03-08 | Disposition: A | Discharge status: Skilled Nursing Facility | Source: Skilled Nursing Facility | Attending: Emergency Medicine | Admitting: Emergency Medicine

## 2024-03-08 ENCOUNTER — Other Ambulatory Visit: Payer: Self-pay

## 2024-03-08 DIAGNOSIS — I4891 Unspecified atrial fibrillation: Secondary | ICD-10-CM | POA: Diagnosis not present

## 2024-03-08 DIAGNOSIS — Z7901 Long term (current) use of anticoagulants: Secondary | ICD-10-CM | POA: Diagnosis not present

## 2024-03-08 DIAGNOSIS — N390 Urinary tract infection, site not specified: Secondary | ICD-10-CM | POA: Insufficient documentation

## 2024-03-08 DIAGNOSIS — R300 Dysuria: Secondary | ICD-10-CM | POA: Diagnosis present

## 2024-03-08 LAB — CBC WITH DIFFERENTIAL/PLATELET
Abs Immature Granulocytes: 0.07 K/uL (ref 0.00–0.07)
Basophils Absolute: 0 K/uL (ref 0.0–0.1)
Basophils Relative: 1 %
Eosinophils Absolute: 0.2 K/uL (ref 0.0–0.5)
Eosinophils Relative: 3 %
HCT: 32 % — ABNORMAL LOW (ref 39.0–52.0)
Hemoglobin: 9.9 g/dL — ABNORMAL LOW (ref 13.0–17.0)
Immature Granulocytes: 1 %
Lymphocytes Relative: 20 %
Lymphs Abs: 1.2 K/uL (ref 0.7–4.0)
MCH: 28.9 pg (ref 26.0–34.0)
MCHC: 30.9 g/dL (ref 30.0–36.0)
MCV: 93.3 fL (ref 80.0–100.0)
Monocytes Absolute: 0.5 K/uL (ref 0.1–1.0)
Monocytes Relative: 8 %
Neutro Abs: 4.2 K/uL (ref 1.7–7.7)
Neutrophils Relative %: 67 %
Platelets: 207 K/uL (ref 150–400)
RBC: 3.43 MIL/uL — ABNORMAL LOW (ref 4.22–5.81)
RDW: 15.6 % — ABNORMAL HIGH (ref 11.5–15.5)
WBC: 6.2 K/uL (ref 4.0–10.5)
nRBC: 0 % (ref 0.0–0.2)

## 2024-03-08 LAB — URINALYSIS, ROUTINE W REFLEX MICROSCOPIC
Bacteria, UA: NONE SEEN
Bilirubin Urine: NEGATIVE
Glucose, UA: NEGATIVE mg/dL
Hgb urine dipstick: NEGATIVE
Ketones, ur: NEGATIVE mg/dL
Nitrite: POSITIVE — AB
Protein, ur: NEGATIVE mg/dL
Specific Gravity, Urine: 1.013 (ref 1.005–1.030)
pH: 7 (ref 5.0–8.0)

## 2024-03-08 LAB — BASIC METABOLIC PANEL WITH GFR
Anion gap: 10 (ref 5–15)
BUN: 28 mg/dL — ABNORMAL HIGH (ref 8–23)
CO2: 24 mmol/L (ref 22–32)
Calcium: 8.8 mg/dL — ABNORMAL LOW (ref 8.9–10.3)
Chloride: 108 mmol/L (ref 98–111)
Creatinine, Ser: 1.38 mg/dL — ABNORMAL HIGH (ref 0.61–1.24)
GFR, Estimated: 50 mL/min — ABNORMAL LOW (ref >60)
Glucose, Bld: 98 mg/dL (ref 70–99)
Potassium: 4.1 mmol/L (ref 3.5–5.1)
Sodium: 142 mmol/L (ref 135–145)

## 2024-03-08 MED ORDER — CEPHALEXIN 500 MG PO CAPS: 500.0000 mg | ORAL_CAPSULE | Freq: Once | ORAL | Status: DC

## 2024-03-08 MED ORDER — CEPHALEXIN 500 MG PO CAPS: 500.0000 mg | ORAL_CAPSULE | Freq: Three times a day (TID) | ORAL | 0 refills | Status: AC

## 2024-03-08 NOTE — ED Provider Notes (Signed)
 [place] EMERGENCY DEPARTMENT AT [facility] Provider Note   CSN: [id number] Arrival date & time: 03/08/24  1057     Patient presents with: Dysuria   Philip Ward is a 83 y.o. male.   Patient with ongoing pain when he urinates.  He is on his last day of Cipro  but still has some discomfort with urination.  He was put on Pyridium and Cipro  for 3 days.  Today is his third day of his antibiotic.  Denies any fever.  He is not having any abdominal pain or difficulty emptying his bladder.  He only has discomfort when he urinates.  Patient does have history of A-fib on Eliquis .  History of bladder cancer.  History of stroke.  Right now he is not having any pain in his abdomen.  He is not having any urinary retention.  Denies any fevers or chills.  Discomfort only at the head of his penis when he urinates.  He does feel like he gets everything out when he does pee.  The history is provided by the patient.       Prior to Admission medications  Medication Sig Start Date End Date Taking? Authorizing Provider  cephALEXin  (KEFLEX ) 500 MG capsule Take 1 capsule (500 mg total) by mouth 3 (three) times daily for 5 days. 03/08/24 03/13/24 Yes Eveny Anastas, DO  amLODipine  (NORVASC ) 2.5 MG tablet Take 2.5 mg by mouth daily.    [provider]  ELIQUIS  2.5 MG TABS tablet TAKE 1 TABLET BY MOUTH TWICE A DAY 09/06/23   Patwardhan, Manish J, MD  fenofibrate 160 MG tablet Take 160 mg by mouth daily. 10/05/19   [provider]  memantine  (NAMENDA ) 10 MG tablet Take 1 tablet (10 mg total) by mouth 2 (two) times daily. 10/20/23   Whitfield Raisin, NP  metoprolol  succinate (TOPROL  XL) 25 MG 24 hr tablet Take 1 tablet (25 mg total) by mouth in the morning and at bedtime. 06/28/22   Custovic, Sabina, DO  Multiple Vitamin (MULITIVITAMIN WITH MINERALS) TABS Take 1 tablet by mouth daily.    [provider]  pantoprazole (PROTONIX) 40 MG tablet Take 40 mg by mouth daily. 10/05/19    [provider]  vitamin C  (ASCORBIC ACID ) 500 MG tablet Take 500 mg by mouth daily.    [provider]    Allergies: Statins and Statins    Review of Systems  Updated Vital Signs BP 135/80   Pulse (!) 108   Temp 98 F (36.7 C)   Resp 18   SpO2 99%   Physical Exam Vitals and nursing note reviewed.  Constitutional:      General: He is not in acute distress.    Appearance: He is well-developed. He is not ill-appearing.  HENT:     Head: Normocephalic and atraumatic.     Nose: Nose normal.     Mouth/Throat:     Mouth: Mucous membranes are moist.  Eyes:     Extraocular Movements: Extraocular movements intact.     Conjunctiva/sclera: Conjunctivae normal.     Pupils: Pupils are equal, round, and reactive to light.  Cardiovascular:     Rate and Rhythm: Normal rate and regular rhythm.     Pulses: Normal pulses.     Heart sounds: Normal heart sounds. No murmur heard. Pulmonary:     Effort: Pulmonary effort is normal. No respiratory distress.     Breath sounds: Normal breath sounds.  Abdominal:     General: Abdomen is  flat.     Palpations: Abdomen is soft.     Tenderness: There is no abdominal tenderness.  Musculoskeletal:        General: No swelling.     Cervical back: Normal range of motion and neck supple.  Skin:    General: Skin is warm and dry.     Capillary Refill: Capillary refill takes less than 2 seconds.  Neurological:     General: No focal deficit present.     Mental Status: He is alert.  Psychiatric:        Mood and Affect: Mood normal.     (all labs ordered are listed, but only abnormal results are displayed) Labs Reviewed  CBC WITH DIFFERENTIAL/PLATELET - Abnormal; Notable for the following components:      Result Value   RBC 3.43 (*)    Hemoglobin 9.9 (*)    HCT 32.0 (*)    RDW 15.6 (*)    All other components within normal limits  BASIC METABOLIC PANEL WITH GFR - Abnormal; Notable for the following components:   BUN 28 (*)     Creatinine, Ser 1.38 (*)    Calcium 8.8 (*)    GFR, Estimated 50 (*)    All other components within normal limits  URINE CULTURE  URINALYSIS, ROUTINE W REFLEX MICROSCOPIC    EKG: None  Radiology: No results found.   Procedures   Medications Ordered in the ED  cephALEXin  (KEFLEX ) capsule 500 mg (has no administration in time range)                                    Medical Decision Making Amount and/or Complexity of Data Reviewed Labs: ordered.  Risk Prescription drug management.   Philip Ward is here with painful urination.  Normal vitals.  No fever.  He is currently on a 3-day course of Cipro  having taken 2 days already.  He is also on Pyridium.  Urine has been orange.  Does not have any difficulty emptying his bladder.  Denies any fever or chills.  No nausea or vomiting.  He has no abdominal pain.  He only has pain while he is urinating.  He feels like he is emptying his bladder completely.  He is not having any flank pain.  No chest pain shortness of breath.  UltimatelyWill check basic labs recheck urinalysis and send urine culture.  I suspect it will be difficult to interpret urinalysis given Pyridium and may empirically treat with Keflex .  And send urine culture.  He is not having any discomfort at this time.  Very well-appearing.  I have no concern for any intra-abdominal process at this time.  Lab work overall reassuring.  No leukocytosis no significant anemia or electrolyte abnormality otherwise.  Will treat UTI with Keflex .  Urine culture has been sent.  Vital signs are reassuring.  No concern for sepsis or other acute process at this time.  Understands return precautions and discharge.  This chart was dictated using voice recognition software.  Despite best efforts to proofread,  errors can occur which can change the documentation meaning.      Final diagnoses:  Lower urinary tract infectious disease    ED Discharge Orders          Ordered     cephALEXin  (KEFLEX ) 500 MG capsule  3 times daily        03/08/24 1555  Ruthe Cornet, DO 03/08/24 1557

## 2024-03-08 NOTE — ED Triage Notes (Signed)
 Pt BIBA from [facility] for dysuria, last day of cipro  for a UTI but still has discomfort with urination. Denies abd pain, NV   142/90 HR 90

## 2024-03-08 NOTE — Discharge Instructions (Addendum)
 Return if you develop fever, abdominal pain, nausea vomiting or other concerning symptoms.  Take your next dose of antibiotic tomorrow.

## 2024-03-09 LAB — URINE CULTURE: Culture: NO GROWTH

## 2024-05-16 ENCOUNTER — Ambulatory Visit: Admitting: Adult Health
